# Patient Record
Sex: Male | Born: 1940 | Race: White | Hispanic: No | Marital: Married | Smoking: Current every day smoker
Health system: Southern US, Community
[De-identification: ages and names within clinical notes are randomized; demographics above are authoritative.]

## PROBLEM LIST (undated history)

## (undated) DIAGNOSIS — I10 Essential (primary) hypertension: Secondary | ICD-10-CM

## (undated) DIAGNOSIS — E7800 Pure hypercholesterolemia, unspecified: Secondary | ICD-10-CM

---

## 2004-08-21 ENCOUNTER — Ambulatory Visit (HOSPITAL_COMMUNITY): Admission: RE | Admit: 2004-08-21 | Discharge: 2004-08-21 | Payer: Self-pay | Admitting: Gastroenterology

## 2011-06-29 DIAGNOSIS — N529 Male erectile dysfunction, unspecified: Secondary | ICD-10-CM | POA: Insufficient documentation

## 2011-06-29 DIAGNOSIS — N3941 Urge incontinence: Secondary | ICD-10-CM | POA: Insufficient documentation

## 2014-12-06 ENCOUNTER — Other Ambulatory Visit: Payer: Self-pay | Admitting: Gastroenterology

## 2014-12-18 ENCOUNTER — Other Ambulatory Visit: Payer: Self-pay | Admitting: Gastroenterology

## 2014-12-18 DIAGNOSIS — K635 Polyp of colon: Secondary | ICD-10-CM

## 2015-01-07 ENCOUNTER — Other Ambulatory Visit: Payer: Self-pay

## 2015-01-08 ENCOUNTER — Ambulatory Visit
Admission: RE | Admit: 2015-01-08 | Discharge: 2015-01-08 | Disposition: A | Discharge status: Home / Self Care | Payer: PPO | Source: Ambulatory Visit | Attending: Gastroenterology | Admitting: Gastroenterology

## 2015-01-08 DIAGNOSIS — K635 Polyp of colon: Secondary | ICD-10-CM

## 2015-02-25 DIAGNOSIS — N401 Enlarged prostate with lower urinary tract symptoms: Secondary | ICD-10-CM

## 2015-02-25 DIAGNOSIS — N138 Other obstructive and reflux uropathy: Secondary | ICD-10-CM | POA: Insufficient documentation

## 2015-07-25 DIAGNOSIS — G4731 Primary central sleep apnea: Secondary | ICD-10-CM | POA: Diagnosis not present

## 2015-09-02 DIAGNOSIS — N401 Enlarged prostate with lower urinary tract symptoms: Secondary | ICD-10-CM | POA: Diagnosis not present

## 2015-09-02 DIAGNOSIS — N3941 Urge incontinence: Secondary | ICD-10-CM | POA: Diagnosis not present

## 2015-09-02 DIAGNOSIS — N528 Other male erectile dysfunction: Secondary | ICD-10-CM | POA: Diagnosis not present

## 2015-09-02 DIAGNOSIS — R972 Elevated prostate specific antigen [PSA]: Secondary | ICD-10-CM | POA: Diagnosis not present

## 2015-11-20 DIAGNOSIS — I10 Essential (primary) hypertension: Secondary | ICD-10-CM | POA: Diagnosis not present

## 2015-11-20 DIAGNOSIS — M109 Gout, unspecified: Secondary | ICD-10-CM | POA: Diagnosis not present

## 2015-11-20 DIAGNOSIS — M1990 Unspecified osteoarthritis, unspecified site: Secondary | ICD-10-CM | POA: Diagnosis not present

## 2015-11-20 DIAGNOSIS — G4733 Obstructive sleep apnea (adult) (pediatric): Secondary | ICD-10-CM | POA: Diagnosis not present

## 2015-11-20 DIAGNOSIS — E782 Mixed hyperlipidemia: Secondary | ICD-10-CM | POA: Diagnosis not present

## 2016-04-01 DIAGNOSIS — H524 Presbyopia: Secondary | ICD-10-CM | POA: Diagnosis not present

## 2016-04-01 DIAGNOSIS — Z0100 Encounter for examination of eyes and vision without abnormal findings: Secondary | ICD-10-CM | POA: Diagnosis not present

## 2016-04-01 DIAGNOSIS — Z961 Presence of intraocular lens: Secondary | ICD-10-CM | POA: Diagnosis not present

## 2016-04-10 DIAGNOSIS — R972 Elevated prostate specific antigen [PSA]: Secondary | ICD-10-CM | POA: Diagnosis not present

## 2016-04-10 DIAGNOSIS — N528 Other male erectile dysfunction: Secondary | ICD-10-CM | POA: Diagnosis not present

## 2016-04-10 DIAGNOSIS — N3941 Urge incontinence: Secondary | ICD-10-CM | POA: Diagnosis not present

## 2016-04-10 DIAGNOSIS — N401 Enlarged prostate with lower urinary tract symptoms: Secondary | ICD-10-CM | POA: Diagnosis not present

## 2016-08-03 DIAGNOSIS — Z0000 Encounter for general adult medical examination without abnormal findings: Secondary | ICD-10-CM | POA: Diagnosis not present

## 2016-08-03 DIAGNOSIS — I10 Essential (primary) hypertension: Secondary | ICD-10-CM | POA: Diagnosis not present

## 2016-08-03 DIAGNOSIS — G4733 Obstructive sleep apnea (adult) (pediatric): Secondary | ICD-10-CM | POA: Diagnosis not present

## 2016-08-03 DIAGNOSIS — E782 Mixed hyperlipidemia: Secondary | ICD-10-CM | POA: Diagnosis not present

## 2016-08-03 DIAGNOSIS — Z125 Encounter for screening for malignant neoplasm of prostate: Secondary | ICD-10-CM | POA: Diagnosis not present

## 2016-08-03 DIAGNOSIS — R972 Elevated prostate specific antigen [PSA]: Secondary | ICD-10-CM | POA: Diagnosis not present

## 2016-08-03 DIAGNOSIS — M1990 Unspecified osteoarthritis, unspecified site: Secondary | ICD-10-CM | POA: Diagnosis not present

## 2016-08-03 DIAGNOSIS — M109 Gout, unspecified: Secondary | ICD-10-CM | POA: Diagnosis not present

## 2016-08-03 DIAGNOSIS — N529 Male erectile dysfunction, unspecified: Secondary | ICD-10-CM | POA: Diagnosis not present

## 2016-08-25 ENCOUNTER — Encounter: Payer: Self-pay | Admitting: Emergency Medicine

## 2016-08-25 ENCOUNTER — Emergency Department
Admission: EM | Admit: 2016-08-25 | Discharge: 2016-08-26 | Disposition: A | Discharge status: Home / Self Care | Payer: PPO | Attending: Emergency Medicine | Admitting: Emergency Medicine

## 2016-08-25 DIAGNOSIS — I10 Essential (primary) hypertension: Secondary | ICD-10-CM

## 2016-08-25 DIAGNOSIS — F17290 Nicotine dependence, other tobacco product, uncomplicated: Secondary | ICD-10-CM | POA: Insufficient documentation

## 2016-08-25 DIAGNOSIS — R110 Nausea: Secondary | ICD-10-CM | POA: Diagnosis not present

## 2016-08-25 HISTORY — DX: Pure hypercholesterolemia, unspecified: E78.00

## 2016-08-25 HISTORY — DX: Essential (primary) hypertension: I10

## 2016-08-25 LAB — BASIC METABOLIC PANEL
ANION GAP: 7 (ref 5–15)
BUN: 21 mg/dL — ABNORMAL HIGH (ref 6–20)
CALCIUM: 8.8 mg/dL — AB (ref 8.9–10.3)
CHLORIDE: 103 mmol/L (ref 101–111)
CO2: 25 mmol/L (ref 22–32)
Creatinine, Ser: 1.24 mg/dL (ref 0.61–1.24)
GFR calc non Af Amer: 56 mL/min — ABNORMAL LOW (ref >60)
Glucose, Bld: 131 mg/dL — ABNORMAL HIGH (ref 65–99)
Potassium: 4.3 mmol/L (ref 3.5–5.1)
Sodium: 135 mmol/L (ref 135–145)

## 2016-08-25 LAB — CBC
HCT: 40.7 % (ref 40.0–52.0)
HEMOGLOBIN: 13.7 g/dL (ref 13.0–18.0)
MCH: 30.1 pg (ref 26.0–34.0)
MCHC: 33.6 g/dL (ref 32.0–36.0)
MCV: 89.5 fL (ref 80.0–100.0)
Platelets: 176 10*3/uL (ref 150–440)
RBC: 4.55 MIL/uL (ref 4.40–5.90)
RDW: 13.6 % (ref 11.5–14.5)
WBC: 9.5 10*3/uL (ref 3.8–10.6)

## 2016-08-25 LAB — TROPONIN I: Troponin I: <0.03 ng/mL (ref <0.03)

## 2016-08-25 NOTE — ED Triage Notes (Signed)
Pt ambulatory to triage with steady gait with c/o hypertension, nausea and headache starting this evening. Pt reports was working outside and began to have headache and feel nauseous. Pt states checked bp at home, 183/91. Pt denies chest pain, shortness of breath, or dizziness. Pt alert and oriented x 4, respirations even and unlabored.

## 2016-08-26 LAB — TROPONIN I

## 2016-08-26 NOTE — Discharge Instructions (Signed)
Please follow up with your primary care physician.

## 2016-08-26 NOTE — ED Notes (Signed)
Pt discharged to home.  Family member driving.  Discharge instructions reviewed.  Verbalized understanding.  No questions or concerns at this time.  Teach back verified.  Pt in NAD.  No items left in ED.   

## 2016-08-26 NOTE — ED Provider Notes (Signed)
[facility] Emergency Department Provider Note   ____________________________________________   First MD Initiated Contact with Patient 08/25/16 2312     (approximate)  I have reviewed the triage vital signs and the nursing notes.   HISTORY  Chief Complaint Hypertension    HPI Jon Fischer is a 76 y.o. male who comes into the hospital today with some high blood pressure and nausea. He reports that he was in his right around 5 PM and started feeling a little nauseous. He took the blood pressure and 164/101. The patient reports that he went into the house and tried to relax and sit but every time he took a blood pressure Going up. His highest blood pressure was 183/91. He reports his blood pressures typically in the 120s over 60s. The patient denies any chest pain and dizziness that he did have a slight headache. He denies shortness of breath as well. The patient took his blood pressure medicines this morning and didn't take anything else tonight. He reports that when he was in the ER he was picking up limbs and he had been walking his dog earlier. The patient states that he had worked up a sweat while he was in the yard. Currently though the patient reports that he is not sweating and his nausea is gone. He is here today for evaluation of these symptoms.   Past Medical History:  Diagnosis Date  . Hypercholesteremia   . Hypertension     There are no active problems to display for this patient.   History reviewed. No pertinent surgical history.  Prior to Admission medications   Not on File    Allergies Patient has no known allergies.  No family history on file.  Social History Social History  Substance Use Topics  . Smoking status: Current Every Day Smoker    Types: Cigars  . Smokeless tobacco: Never Used  . Alcohol use Yes     Comment: occ    Review of Systems Constitutional: No fever/chills Eyes: No visual changes. ENT: No sore  throat. Cardiovascular: Denies chest pain. Respiratory: Denies shortness of breath. Gastrointestinal: Nausea with No abdominal pain. no vomiting.  No diarrhea.  No constipation. Genitourinary: Negative for dysuria. Musculoskeletal: Negative for back pain. Skin: Negative for rash. Neurological: Negative for headaches, focal weakness or numbness.  10-point ROS otherwise negative.  ____________________________________________   PHYSICAL EXAM:  VITAL SIGNS: ED Triage Vitals  Enc Vitals Group     BP 08/25/16 1910 (!) 174/88     Pulse Rate 08/25/16 1910 83     Resp 08/25/16 1910 18     Temp 08/25/16 1910 98.1 F (36.7 C)     Temp Source 08/25/16 1910 Oral     SpO2 08/25/16 1910 98 %     Weight 08/25/16 1911 240 lb (108.9 kg)     Height 08/25/16 1911 5\' 10"  (1.778 m)     Head Circumference --      Peak Flow --      Pain Score --      Pain Loc --      Pain Edu? --      Excl. in [place]? --     Constitutional: Alert and oriented. Well appearing and in no acute distress. Eyes: Conjunctivae are normal. PERRL. EOMI. Head: Atraumatic. Nose: No congestion/rhinnorhea. Mouth/Throat: Mucous membranes are moist.  Oropharynx non-erythematous. Cardiovascular: Normal rate, regular rhythm. Grossly normal heart sounds.  Good peripheral circulation. Respiratory: Normal respiratory effort.  No retractions. Lungs  CTAB. Gastrointestinal: Soft and nontender. No distention. Positive bowel sounds Musculoskeletal: No lower extremity tenderness nor edema.   Neurologic:  Normal speech and language. Cranial nerves II through XII are grossly intact with no focal motor or neuro deficits Skin:  Skin is warm, dry and intact.  Psychiatric: Mood and affect are normal.   ____________________________________________   LABS (all labs ordered are listed, but only abnormal results are displayed)  Labs Reviewed  BASIC METABOLIC PANEL - Abnormal; Notable for the following:       Result Value   Glucose, Bld 131  (*)    BUN 21 (*)    Calcium 8.8 (*)    GFR calc non Af Amer 56 (*)    All other components within normal limits  CBC  TROPONIN I  TROPONIN I   ____________________________________________  EKG  ED ECG REPORT I, Loney Hering, the attending physician, personally viewed and interpreted this ECG.   Date: 08/25/2016  EKG Time: 1916  Rate: 83  Rhythm: normal sinus rhythm  Axis: normal  Intervals:none  ST&T Change: none  ____________________________________________  RADIOLOGY  none ____________________________________________   PROCEDURES  Procedure(s) performed: None  Procedures  Critical Care performed: No  ____________________________________________   INITIAL IMPRESSION / ASSESSMENT AND PLAN / ED COURSE  Pertinent labs & imaging results that were available during my care of the patient were reviewed by me and considered in my medical decision making (see chart for details).  This is a 76 year old male who comes into the hospital today was similar to blood pressure as well as low but nausea. The patient was concerned because blood pressure was going up. He did not have any shortness of breath headache or chest pain. The patient's initial blood work is unremarkable. During my evaluation I discussed the patient that sometimes when he continually checked her blood pressure will go up. Here in the emergency department though his blood pressures in the 140s over 90s. He has not received any medication. We'll repeat the patient's troponin and we will disposition the patient.     The patient's symptoms are resolved at this time and his blood pressure is improved. I informed the patient that he should follow-up with his doctor to determine if they wanted do anything else about the blood pressure but I did tell him that maybe he should decrease the heavy lifting up limbs in his yard at this time until he is evaluated by his doctor. The patient will be discharged home to  follow-up with his primary care physician. ____________________________________________   FINAL CLINICAL IMPRESSION(S) / ED DIAGNOSES  Final diagnoses:  Hypertension, unspecified type  Nausea      NEW MEDICATIONS STARTED DURING THIS VISIT:  There are no discharge medications for this patient.    Note:  This document was prepared using Dragon voice recognition software and may include unintentional dictation errors.    Loney Hering, MD 08/26/16 [phone]

## 2016-10-09 DIAGNOSIS — N528 Other male erectile dysfunction: Secondary | ICD-10-CM | POA: Diagnosis not present

## 2016-10-09 DIAGNOSIS — N138 Other obstructive and reflux uropathy: Secondary | ICD-10-CM | POA: Diagnosis not present

## 2016-10-09 DIAGNOSIS — N401 Enlarged prostate with lower urinary tract symptoms: Secondary | ICD-10-CM | POA: Diagnosis not present

## 2016-10-09 DIAGNOSIS — R972 Elevated prostate specific antigen [PSA]: Secondary | ICD-10-CM | POA: Diagnosis not present

## 2017-03-16 DIAGNOSIS — L821 Other seborrheic keratosis: Secondary | ICD-10-CM | POA: Diagnosis not present

## 2017-03-16 DIAGNOSIS — X32XXXA Exposure to sunlight, initial encounter: Secondary | ICD-10-CM | POA: Diagnosis not present

## 2017-03-16 DIAGNOSIS — L814 Other melanin hyperpigmentation: Secondary | ICD-10-CM | POA: Diagnosis not present

## 2017-04-06 DIAGNOSIS — G4731 Primary central sleep apnea: Secondary | ICD-10-CM | POA: Diagnosis not present

## 2017-04-14 DIAGNOSIS — H524 Presbyopia: Secondary | ICD-10-CM | POA: Diagnosis not present

## 2017-04-14 DIAGNOSIS — H52222 Regular astigmatism, left eye: Secondary | ICD-10-CM | POA: Diagnosis not present

## 2017-04-14 DIAGNOSIS — H5202 Hypermetropia, left eye: Secondary | ICD-10-CM | POA: Diagnosis not present

## 2017-04-14 DIAGNOSIS — Z961 Presence of intraocular lens: Secondary | ICD-10-CM | POA: Diagnosis not present

## 2017-04-16 DIAGNOSIS — N401 Enlarged prostate with lower urinary tract symptoms: Secondary | ICD-10-CM | POA: Diagnosis not present

## 2017-04-16 DIAGNOSIS — N529 Male erectile dysfunction, unspecified: Secondary | ICD-10-CM | POA: Diagnosis not present

## 2017-04-16 DIAGNOSIS — N3941 Urge incontinence: Secondary | ICD-10-CM | POA: Diagnosis not present

## 2017-04-16 DIAGNOSIS — R972 Elevated prostate specific antigen [PSA]: Secondary | ICD-10-CM | POA: Diagnosis not present

## 2017-04-20 ENCOUNTER — Other Ambulatory Visit: Payer: Self-pay

## 2017-04-21 ENCOUNTER — Ambulatory Visit (INDEPENDENT_AMBULATORY_CARE_PROVIDER_SITE_OTHER): Payer: PPO | Admitting: Podiatry

## 2017-04-21 ENCOUNTER — Encounter: Payer: Self-pay | Admitting: Podiatry

## 2017-04-21 VITALS — BP 138/73 | HR 70 | Resp 16

## 2017-04-21 DIAGNOSIS — M79676 Pain in unspecified toe(s): Secondary | ICD-10-CM

## 2017-04-21 DIAGNOSIS — L601 Onycholysis: Secondary | ICD-10-CM | POA: Diagnosis not present

## 2017-04-21 DIAGNOSIS — B351 Tinea unguium: Secondary | ICD-10-CM | POA: Diagnosis not present

## 2017-04-21 DIAGNOSIS — L603 Nail dystrophy: Secondary | ICD-10-CM | POA: Diagnosis not present

## 2017-04-21 NOTE — Progress Notes (Signed)
  Subjective:  Patient ID: Jon Fischer, male    DOB: 1940/08/27,  MRN: [id number] HPI Chief Complaint  Patient presents with  . Nail Problem    Patient presents for nail care-thick, discolored nails for years, hard to cut himself, callused areas around nails    76 y.o. male presents with the above complaint.     Past Medical History:  Diagnosis Date  . Hypercholesteremia   . Hypertension    No past surgical history on file.  Current Outpatient Prescriptions:  .  allopurinol (ZYLOPRIM) 100 MG tablet, Take by mouth., Disp: , Rfl:  .  Ascorbic Acid (VITAMIN C) 1000 MG tablet, Take 1,000 mg by mouth., Disp: , Rfl:  .  aspirin EC 81 MG tablet, Take by mouth., Disp: , Rfl:  .  DOCOSAHEXAENOIC ACID PO, Take 2 g by mouth., Disp: , Rfl:  .  docusate sodium (STOOL SOFTENER) 100 MG capsule, Take 100 mg by mouth., Disp: , Rfl:  .  lisinopril (PRINIVIL,ZESTRIL) 10 MG tablet, Take 20 mg by mouth., Disp: , Rfl:  .  Menthol, Topical Analgesic, (MINERAL ICE) 2 % GEL, , Disp: , Rfl:  .  sildenafil (VIAGRA) 100 MG tablet, Take 100 mg by mouth., Disp: , Rfl:  .  simvastatin (ZOCOR) 10 MG tablet, Take 10 mg by mouth., Disp: , Rfl:  .  verapamil (VERELAN PM) 120 MG 24 hr capsule, Take 120 mg by mouth., Disp: , Rfl:  .  vitamin E 400 UNIT capsule, Take by mouth., Disp: , Rfl:   No Known Allergies Review of Systems  Musculoskeletal: Positive for arthralgias.  All other systems reviewed and are negative.  Objective:   Vitals:   04/21/17 1005  BP: 138/73  Pulse: 70  Resp: 16    General: Well developed, nourished, in no acute distress, alert and oriented x3   Dermatological: Skin is warm, dry and supple bilateral. Nails x 10 are well maintained; remaining integument appears unremarkable at this time. There are no open sores, no preulcerative lesions, no rash or signs of infection present.His toenails are long thick yellow dystrophic clinically mycotic severely thickened and incurvated. He  also has thick skin surrounding the nails and some peeling indicative of tinea pedis.  Vascular: Dorsalis Pedis artery and Posterior Tibial artery pedal pulses are 2/4 bilateral with immedate capillary fill time. Pedal hair growth present. No varicosities and no lower extremity edema present bilateral.   Neruologic: Grossly intact via light touch bilateral. Vibratory intact via tuning fork bilateral. Protective threshold with Semmes Wienstein monofilament intact to all pedal sites bilateral. Patellar and Achilles deep tendon reflexes 2+ bilateral. No Babinski or clonus noted bilateral.   Musculoskeletal: No gross boney pedal deformities bilateral. No pain, crepitus, or limitation noted with foot and ankle range of motion bilateral. Muscular strength 5/5 in all groups tested bilateral.  Gait: Unassisted, Nonantalgic.    Radiographs:  None taken  Assessment & Plan:   Assessment: Pain in limb secondary to onychomycosis and tinea pedis.  Plan: To samples of the nails and skin today to be sent for pathologic evaluation one notified him as to those results.     Rigo Letts T. [place], [place]

## 2017-04-22 DIAGNOSIS — E782 Mixed hyperlipidemia: Secondary | ICD-10-CM | POA: Diagnosis not present

## 2017-04-22 DIAGNOSIS — M109 Gout, unspecified: Secondary | ICD-10-CM | POA: Diagnosis not present

## 2017-04-22 DIAGNOSIS — I10 Essential (primary) hypertension: Secondary | ICD-10-CM | POA: Diagnosis not present

## 2017-05-19 ENCOUNTER — Ambulatory Visit (INDEPENDENT_AMBULATORY_CARE_PROVIDER_SITE_OTHER): Payer: PPO | Admitting: Podiatry

## 2017-05-19 ENCOUNTER — Encounter: Payer: Self-pay | Admitting: Podiatry

## 2017-05-19 DIAGNOSIS — Z79899 Other long term (current) drug therapy: Secondary | ICD-10-CM | POA: Diagnosis not present

## 2017-05-19 DIAGNOSIS — L603 Nail dystrophy: Secondary | ICD-10-CM | POA: Diagnosis not present

## 2017-05-19 MED ORDER — TERBINAFINE HCL 250 MG PO TABS: 250.0000 mg | ORAL_TABLET | Freq: Every day | ORAL | 0 refills | Status: DC

## 2017-05-19 NOTE — Patient Instructions (Signed)

## 2017-05-19 NOTE — Progress Notes (Signed)
He presents today for follow-up of his pathology report for his toenails. States that he is doing same.  Objective: Pathology report demonstrates dermatophytic infection.  Assessment: Onychomycosis.  Plan: Discussed etiology pathology conservative versus surgical therapies with him. Discussed the use of laser therapy topical therapy as well as oral therapy. He understands this and is amenable to it we started him on Lamisil 250 mg tablets 1 by mouth daily 30 days. He is just had a liver profile performed recently so we will not have to ask for another one at this point. I will ask for 1:30 days from now.

## 2017-06-16 ENCOUNTER — Ambulatory Visit: Payer: PPO | Admitting: Podiatry

## 2017-06-16 ENCOUNTER — Encounter: Payer: Self-pay | Admitting: Podiatry

## 2017-06-16 DIAGNOSIS — Z79899 Other long term (current) drug therapy: Secondary | ICD-10-CM

## 2017-06-16 DIAGNOSIS — M79676 Pain in unspecified toe(s): Secondary | ICD-10-CM | POA: Diagnosis not present

## 2017-06-16 DIAGNOSIS — B351 Tinea unguium: Secondary | ICD-10-CM

## 2017-06-16 DIAGNOSIS — L603 Nail dystrophy: Secondary | ICD-10-CM | POA: Diagnosis not present

## 2017-06-16 MED ORDER — TERBINAFINE HCL 250 MG PO TABS: 250.0000 mg | ORAL_TABLET | Freq: Every day | ORAL | 0 refills | Status: DC

## 2017-06-16 NOTE — Progress Notes (Signed)
He presents today after taking his first 30 days of Lamisil. He states that he was doing very good. No problems taking the medication and rash and fever chills itching. He is also requesting that we trim his toenails for him today.  Objective: Vital signs are stable he is alert and oriented 3. Total toenails involved are 1 through 5 bilateral which do not demonstrate any changes as of yet. His toenails are thick yellow dystrophic with mycotic and painful.  Assessment: Pain in limb secondary to onychomycosis.  Plan: Debridement of toenails 1 through 5 bilateral. I also wrote him another prescription for Lamisil 250 mg tablets #90 and requested another liver profile. Should this blood work back abnormal we will notify me immediately. He will notify us with questions or concerns regarding medication.

## 2017-06-17 ENCOUNTER — Telehealth: Payer: Self-pay | Admitting: Podiatry

## 2017-06-17 LAB — HEPATIC FUNCTION PANEL
ALBUMIN: 4.4 g/dL (ref 3.5–4.8)
ALT: 11 IU/L (ref 0–44)
AST: 15 IU/L (ref 0–40)
Alkaline Phosphatase: 75 IU/L (ref 39–117)
BILIRUBIN, DIRECT: 0.13 mg/dL (ref 0.00–0.40)
Bilirubin Total: 0.4 mg/dL (ref 0.0–1.2)
Total Protein: 6.5 g/dL (ref 6.0–8.5)

## 2017-08-25 DIAGNOSIS — E782 Mixed hyperlipidemia: Secondary | ICD-10-CM | POA: Diagnosis not present

## 2017-08-25 DIAGNOSIS — M109 Gout, unspecified: Secondary | ICD-10-CM | POA: Diagnosis not present

## 2017-08-25 DIAGNOSIS — Z0000 Encounter for general adult medical examination without abnormal findings: Secondary | ICD-10-CM | POA: Diagnosis not present

## 2017-08-25 DIAGNOSIS — R972 Elevated prostate specific antigen [PSA]: Secondary | ICD-10-CM | POA: Diagnosis not present

## 2017-08-25 DIAGNOSIS — Z6836 Body mass index (BMI) 36.0-36.9, adult: Secondary | ICD-10-CM | POA: Diagnosis not present

## 2017-08-25 DIAGNOSIS — M1990 Unspecified osteoarthritis, unspecified site: Secondary | ICD-10-CM | POA: Diagnosis not present

## 2017-08-25 DIAGNOSIS — I10 Essential (primary) hypertension: Secondary | ICD-10-CM | POA: Diagnosis not present

## 2017-08-25 DIAGNOSIS — G4733 Obstructive sleep apnea (adult) (pediatric): Secondary | ICD-10-CM | POA: Diagnosis not present

## 2017-10-04 ENCOUNTER — Encounter: Payer: Self-pay | Admitting: Podiatry

## 2017-10-04 ENCOUNTER — Ambulatory Visit: Payer: PPO | Admitting: Podiatry

## 2017-10-04 DIAGNOSIS — Z79899 Other long term (current) drug therapy: Secondary | ICD-10-CM | POA: Diagnosis not present

## 2017-10-04 DIAGNOSIS — L603 Nail dystrophy: Secondary | ICD-10-CM

## 2017-10-04 MED ORDER — TERBINAFINE HCL 250 MG PO TABS: 250.0000 mg | ORAL_TABLET | Freq: Every day | ORAL | 0 refills | Status: DC

## 2017-10-04 NOTE — Progress Notes (Signed)
He presents today for follow-up of his nail fungus.  He states that he is completed his 120 days of Lamisil therapy had no problems with the medication at all.  He states that he had a slight improvement.  He states that his nails are just growing more slowly.  Objective: Vital signs are stable alert and oriented x3.  Pulses are palpable.  Neurologic sensorium is intact.  His toenails appear to be growing out and they have grown out approximately 25%.  The remainder of the nail still appears to be thick yellow dystrophic and mycotic.  Assessment: Slowly resolving onychomycosis secondary to Lamisil therapy.  Plan: Debridement of toenails today 1 through 5 bilateral cover service secondary to pain.  I also requested that he start Lamisil 250 mg tablets 1 tablet every other day by mouth I will follow-up with him in 3 months.  Should he have questions or concerns regarding medications or therapy protocol he will notify us immediately.

## 2017-10-04 NOTE — Patient Instructions (Signed)
Dr. [name] has sent over a refill for Lamisil to your pharmacy today. The instructions on your bottle will say "take 1 tablet daily", however, he would like for you to take one pill every other day. He will follow up with you in 3 months to re-evaluate your toenails. 

## 2017-10-18 ENCOUNTER — Ambulatory Visit: Payer: PPO | Admitting: Podiatry

## 2017-10-25 DIAGNOSIS — N3941 Urge incontinence: Secondary | ICD-10-CM | POA: Diagnosis not present

## 2017-10-25 DIAGNOSIS — R972 Elevated prostate specific antigen [PSA]: Secondary | ICD-10-CM | POA: Diagnosis not present

## 2017-10-25 DIAGNOSIS — N401 Enlarged prostate with lower urinary tract symptoms: Secondary | ICD-10-CM | POA: Diagnosis not present

## 2017-10-25 DIAGNOSIS — N529 Male erectile dysfunction, unspecified: Secondary | ICD-10-CM | POA: Diagnosis not present

## 2018-01-10 ENCOUNTER — Ambulatory Visit: Payer: PPO | Admitting: Podiatry

## 2018-01-10 ENCOUNTER — Encounter: Payer: Self-pay | Admitting: Podiatry

## 2018-01-10 DIAGNOSIS — Z79899 Other long term (current) drug therapy: Secondary | ICD-10-CM | POA: Diagnosis not present

## 2018-01-10 DIAGNOSIS — L603 Nail dystrophy: Secondary | ICD-10-CM

## 2018-01-10 MED ORDER — TERBINAFINE HCL 250 MG PO TABS: 250.0000 mg | ORAL_TABLET | Freq: Every day | ORAL | 0 refills | Status: DC

## 2018-01-10 NOTE — Progress Notes (Signed)
He presents today for follow-up of his onychomycosis states that is almost grown out.  Objective: Vital signs are stable he is alert and oriented x3.  He had no problems taking his medication whatsoever.  Pulses are palpable toenails appear to be 60 to 80% grown out.  Assessment: Well-healing onychomycosis long-term therapy with Lamisil.  Plan: Number to continue with Lamisil every other day for the next 60 days follow-up with him in 3 months I expect this to be completely resolved by that time.

## 2018-01-18 DIAGNOSIS — R972 Elevated prostate specific antigen [PSA]: Secondary | ICD-10-CM | POA: Diagnosis not present

## 2018-02-24 DIAGNOSIS — Z6836 Body mass index (BMI) 36.0-36.9, adult: Secondary | ICD-10-CM | POA: Diagnosis not present

## 2018-02-24 DIAGNOSIS — E782 Mixed hyperlipidemia: Secondary | ICD-10-CM | POA: Diagnosis not present

## 2018-02-24 DIAGNOSIS — R202 Paresthesia of skin: Secondary | ICD-10-CM | POA: Diagnosis not present

## 2018-02-24 DIAGNOSIS — I10 Essential (primary) hypertension: Secondary | ICD-10-CM | POA: Diagnosis not present

## 2018-04-13 ENCOUNTER — Ambulatory Visit: Payer: PPO | Admitting: Podiatry

## 2018-04-25 DIAGNOSIS — N3941 Urge incontinence: Secondary | ICD-10-CM | POA: Diagnosis not present

## 2018-04-25 DIAGNOSIS — N528 Other male erectile dysfunction: Secondary | ICD-10-CM | POA: Diagnosis not present

## 2018-04-25 DIAGNOSIS — N401 Enlarged prostate with lower urinary tract symptoms: Secondary | ICD-10-CM | POA: Diagnosis not present

## 2018-04-25 DIAGNOSIS — R972 Elevated prostate specific antigen [PSA]: Secondary | ICD-10-CM | POA: Diagnosis not present

## 2018-07-07 DIAGNOSIS — R311 Benign essential microscopic hematuria: Secondary | ICD-10-CM | POA: Diagnosis not present

## 2018-07-07 DIAGNOSIS — R8289 Other abnormal findings on cytological and histological examination of urine: Secondary | ICD-10-CM | POA: Diagnosis not present

## 2018-07-09 DIAGNOSIS — G4731 Primary central sleep apnea: Secondary | ICD-10-CM | POA: Diagnosis not present

## 2018-07-22 DIAGNOSIS — R972 Elevated prostate specific antigen [PSA]: Secondary | ICD-10-CM | POA: Diagnosis not present

## 2018-08-05 DIAGNOSIS — G4733 Obstructive sleep apnea (adult) (pediatric): Secondary | ICD-10-CM | POA: Diagnosis not present

## 2018-08-09 DIAGNOSIS — G4731 Primary central sleep apnea: Secondary | ICD-10-CM | POA: Diagnosis not present

## 2018-09-02 DIAGNOSIS — G4731 Primary central sleep apnea: Secondary | ICD-10-CM | POA: Diagnosis not present

## 2018-09-09 DIAGNOSIS — G4731 Primary central sleep apnea: Secondary | ICD-10-CM | POA: Diagnosis not present

## 2018-09-12 DIAGNOSIS — I10 Essential (primary) hypertension: Secondary | ICD-10-CM | POA: Diagnosis not present

## 2018-09-12 DIAGNOSIS — R972 Elevated prostate specific antigen [PSA]: Secondary | ICD-10-CM | POA: Diagnosis not present

## 2018-09-12 DIAGNOSIS — Z6837 Body mass index (BMI) 37.0-37.9, adult: Secondary | ICD-10-CM | POA: Diagnosis not present

## 2018-09-12 DIAGNOSIS — E782 Mixed hyperlipidemia: Secondary | ICD-10-CM | POA: Diagnosis not present

## 2018-09-12 DIAGNOSIS — G4733 Obstructive sleep apnea (adult) (pediatric): Secondary | ICD-10-CM | POA: Diagnosis not present

## 2018-09-12 DIAGNOSIS — M109 Gout, unspecified: Secondary | ICD-10-CM | POA: Diagnosis not present

## 2018-09-12 DIAGNOSIS — M1990 Unspecified osteoarthritis, unspecified site: Secondary | ICD-10-CM | POA: Diagnosis not present

## 2018-09-12 DIAGNOSIS — Z0000 Encounter for general adult medical examination without abnormal findings: Secondary | ICD-10-CM | POA: Diagnosis not present

## 2018-10-01 DIAGNOSIS — G4731 Primary central sleep apnea: Secondary | ICD-10-CM | POA: Diagnosis not present

## 2018-10-08 DIAGNOSIS — G4731 Primary central sleep apnea: Secondary | ICD-10-CM | POA: Diagnosis not present

## 2018-11-01 DIAGNOSIS — G4731 Primary central sleep apnea: Secondary | ICD-10-CM | POA: Diagnosis not present

## 2018-11-07 DIAGNOSIS — G4733 Obstructive sleep apnea (adult) (pediatric): Secondary | ICD-10-CM | POA: Diagnosis not present

## 2018-11-08 DIAGNOSIS — G4731 Primary central sleep apnea: Secondary | ICD-10-CM | POA: Diagnosis not present

## 2018-12-01 DIAGNOSIS — G4731 Primary central sleep apnea: Secondary | ICD-10-CM | POA: Diagnosis not present

## 2018-12-08 DIAGNOSIS — G4731 Primary central sleep apnea: Secondary | ICD-10-CM | POA: Diagnosis not present

## 2019-01-01 DIAGNOSIS — G4731 Primary central sleep apnea: Secondary | ICD-10-CM | POA: Diagnosis not present

## 2019-01-08 DIAGNOSIS — G4731 Primary central sleep apnea: Secondary | ICD-10-CM | POA: Diagnosis not present

## 2019-01-31 DIAGNOSIS — G4731 Primary central sleep apnea: Secondary | ICD-10-CM | POA: Diagnosis not present

## 2019-02-07 DIAGNOSIS — G4731 Primary central sleep apnea: Secondary | ICD-10-CM | POA: Diagnosis not present

## 2019-02-10 DIAGNOSIS — G4731 Primary central sleep apnea: Secondary | ICD-10-CM | POA: Diagnosis not present

## 2019-03-03 DIAGNOSIS — G4731 Primary central sleep apnea: Secondary | ICD-10-CM | POA: Diagnosis not present

## 2019-03-10 DIAGNOSIS — G4731 Primary central sleep apnea: Secondary | ICD-10-CM | POA: Diagnosis not present

## 2019-03-29 DIAGNOSIS — E782 Mixed hyperlipidemia: Secondary | ICD-10-CM | POA: Diagnosis not present

## 2019-03-29 DIAGNOSIS — I10 Essential (primary) hypertension: Secondary | ICD-10-CM | POA: Diagnosis not present

## 2019-03-31 DIAGNOSIS — Z7189 Other specified counseling: Secondary | ICD-10-CM | POA: Diagnosis not present

## 2019-03-31 DIAGNOSIS — Z6836 Body mass index (BMI) 36.0-36.9, adult: Secondary | ICD-10-CM | POA: Diagnosis not present

## 2019-03-31 DIAGNOSIS — I10 Essential (primary) hypertension: Secondary | ICD-10-CM | POA: Diagnosis not present

## 2019-03-31 DIAGNOSIS — E782 Mixed hyperlipidemia: Secondary | ICD-10-CM | POA: Diagnosis not present

## 2019-03-31 DIAGNOSIS — M109 Gout, unspecified: Secondary | ICD-10-CM | POA: Diagnosis not present

## 2019-03-31 DIAGNOSIS — L309 Dermatitis, unspecified: Secondary | ICD-10-CM | POA: Diagnosis not present

## 2019-04-10 DIAGNOSIS — R972 Elevated prostate specific antigen [PSA]: Secondary | ICD-10-CM | POA: Diagnosis not present

## 2019-04-30 DIAGNOSIS — R3915 Urgency of urination: Secondary | ICD-10-CM | POA: Diagnosis not present

## 2019-04-30 DIAGNOSIS — R339 Retention of urine, unspecified: Secondary | ICD-10-CM | POA: Diagnosis not present

## 2019-05-04 DIAGNOSIS — R339 Retention of urine, unspecified: Secondary | ICD-10-CM | POA: Diagnosis not present

## 2019-05-04 DIAGNOSIS — N3941 Urge incontinence: Secondary | ICD-10-CM | POA: Diagnosis not present

## 2019-05-04 DIAGNOSIS — R972 Elevated prostate specific antigen [PSA]: Secondary | ICD-10-CM | POA: Diagnosis not present

## 2019-05-04 DIAGNOSIS — N401 Enlarged prostate with lower urinary tract symptoms: Secondary | ICD-10-CM | POA: Diagnosis not present

## 2019-05-04 DIAGNOSIS — N529 Male erectile dysfunction, unspecified: Secondary | ICD-10-CM | POA: Diagnosis not present

## 2019-05-08 DIAGNOSIS — N401 Enlarged prostate with lower urinary tract symptoms: Secondary | ICD-10-CM | POA: Diagnosis not present

## 2019-05-08 DIAGNOSIS — N3941 Urge incontinence: Secondary | ICD-10-CM | POA: Diagnosis not present

## 2019-05-08 DIAGNOSIS — R972 Elevated prostate specific antigen [PSA]: Secondary | ICD-10-CM | POA: Diagnosis not present

## 2019-05-08 DIAGNOSIS — R338 Other retention of urine: Secondary | ICD-10-CM | POA: Diagnosis not present

## 2019-05-09 DIAGNOSIS — R339 Retention of urine, unspecified: Secondary | ICD-10-CM | POA: Diagnosis not present

## 2019-05-09 DIAGNOSIS — Z466 Encounter for fitting and adjustment of urinary device: Secondary | ICD-10-CM | POA: Diagnosis not present

## 2019-05-22 DIAGNOSIS — R972 Elevated prostate specific antigen [PSA]: Secondary | ICD-10-CM | POA: Diagnosis not present

## 2019-05-22 DIAGNOSIS — R339 Retention of urine, unspecified: Secondary | ICD-10-CM | POA: Diagnosis not present

## 2019-05-22 DIAGNOSIS — N401 Enlarged prostate with lower urinary tract symptoms: Secondary | ICD-10-CM | POA: Diagnosis not present

## 2019-05-22 DIAGNOSIS — N529 Male erectile dysfunction, unspecified: Secondary | ICD-10-CM | POA: Diagnosis not present

## 2019-05-22 DIAGNOSIS — N451 Epididymitis: Secondary | ICD-10-CM | POA: Diagnosis not present

## 2019-05-22 DIAGNOSIS — N3941 Urge incontinence: Secondary | ICD-10-CM | POA: Diagnosis not present

## 2019-05-23 ENCOUNTER — Emergency Department
Admission: EM | Admit: 2019-05-23 | Discharge: 2019-05-24 | Disposition: A | Discharge status: Home / Self Care | Payer: PPO | Attending: Emergency Medicine | Admitting: Emergency Medicine

## 2019-05-23 ENCOUNTER — Other Ambulatory Visit: Payer: Self-pay

## 2019-05-23 DIAGNOSIS — I10 Essential (primary) hypertension: Secondary | ICD-10-CM | POA: Diagnosis not present

## 2019-05-23 DIAGNOSIS — N401 Enlarged prostate with lower urinary tract symptoms: Secondary | ICD-10-CM | POA: Insufficient documentation

## 2019-05-23 DIAGNOSIS — R319 Hematuria, unspecified: Secondary | ICD-10-CM | POA: Insufficient documentation

## 2019-05-23 DIAGNOSIS — F17290 Nicotine dependence, other tobacco product, uncomplicated: Secondary | ICD-10-CM | POA: Insufficient documentation

## 2019-05-23 DIAGNOSIS — Z7982 Long term (current) use of aspirin: Secondary | ICD-10-CM | POA: Diagnosis not present

## 2019-05-23 DIAGNOSIS — Z79899 Other long term (current) drug therapy: Secondary | ICD-10-CM | POA: Diagnosis not present

## 2019-05-23 DIAGNOSIS — T8383XA Hemorrhage of genitourinary prosthetic devices, implants and grafts, initial encounter: Secondary | ICD-10-CM | POA: Diagnosis not present

## 2019-05-23 LAB — BASIC METABOLIC PANEL
Anion gap: 13 (ref 5–15)
BUN: 19 mg/dL (ref 8–23)
CO2: 24 mmol/L (ref 22–32)
Calcium: 9.4 mg/dL (ref 8.9–10.3)
Chloride: 102 mmol/L (ref 98–111)
Creatinine, Ser: 1.21 mg/dL (ref 0.61–1.24)
GFR calc Af Amer: >60 mL/min (ref >60)
GFR calc non Af Amer: 57 mL/min — ABNORMAL LOW (ref >60)
Glucose, Bld: 114 mg/dL — ABNORMAL HIGH (ref 70–99)
Potassium: 4.1 mmol/L (ref 3.5–5.1)
Sodium: 139 mmol/L (ref 135–145)

## 2019-05-23 LAB — CBC
HCT: 40.3 % (ref 39.0–52.0)
Hemoglobin: 13.4 g/dL (ref 13.0–17.0)
MCH: 30 pg (ref 26.0–34.0)
MCHC: 33.3 g/dL (ref 30.0–36.0)
MCV: 90.4 fL (ref 80.0–100.0)
Platelets: 262 10*3/uL (ref 150–400)
RBC: 4.46 MIL/uL (ref 4.22–5.81)
RDW: 12.4 % (ref 11.5–15.5)
WBC: 13.3 10*3/uL — ABNORMAL HIGH (ref 4.0–10.5)
nRBC: 0 % (ref 0.0–0.2)

## 2019-05-23 MED ORDER — LIDOCAINE HCL URETHRAL/MUCOSAL 2 % EX GEL
1.0000 "application " | Freq: Once | CUTANEOUS | Status: AC
  Administered 2019-05-24: 1 via URETHRAL
  Filled 2019-05-23: qty 10

## 2019-05-23 NOTE — ED Provider Notes (Signed)
[facility] Emergency Department Provider Note  ____________________________________________  Time seen: Approximately 11:57 PM  I have reviewed the triage vital signs and the nursing notes.   HISTORY  Chief Complaint Hematuria   HPI Jon Fischer is a 78 y.o. 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  Patient seen his urologist yesterday and diagnosed with epididymitis.  He was started on Cipro.  He was told to self cath every evening to help clear the infection.  This evening was the first time the patient attempted to self catheterization.  He denies encountering any difficulties during the process however immediately after started having hematuria.  Has passed several large clots.  He denies abdominal pain, dysuria, fever, flank pain, chills, nausea or vomiting.  Patient is not on blood thinners but does take aspirin.   Patient called his urologist recommended coming to the emergency room for Foley catheter placement to allow for healing of possible traumatic urethral injury.  Past Medical History:  Diagnosis Date  . Hypercholesteremia   . Hypertension     Patient Active Problem List   Diagnosis Date Noted  . Benign prostatic hyperplasia with lower urinary tract symptoms 09/02/2015  . Benign prostatic hyperplasia with urinary obstruction 02/25/2015    History reviewed. No pertinent surgical history.  Prior to Admission medications   Medication Sig Start Date End Date Taking? Authorizing Provider  allopurinol (ZYLOPRIM) 100 MG tablet Take by mouth.    [provider]  Ascorbic Acid (VITAMIN C) 1000 MG tablet Take 1,000 mg by mouth.    [provider]  aspirin EC 81 MG tablet Take by mouth.    [provider]  DOCOSAHEXAENOIC ACID PO Take 2 g by mouth.    [provider]  docusate sodium (STOOL SOFTENER) 100 MG  capsule Take 100 mg by mouth.    [provider]  lisinopril (PRINIVIL,ZESTRIL) 20 MG tablet  08/09/17   [provider]  Menthol, Topical Analgesic, (MINERAL ICE) 2 % GEL  07/09/14   [provider]  sildenafil (VIAGRA) 100 MG tablet Take by mouth. 02/05/17   [provider]  simvastatin (ZOCOR) 10 MG tablet Take by mouth. 06/19/14   [provider]  terbinafine (LAMISIL) 250 MG tablet Take 1 tablet (250 mg total) by mouth daily. 10/04/17   [name], [name], DPM  terbinafine (LAMISIL) 250 MG tablet Take 1 tablet (250 mg total) by mouth daily. 01/10/18   [name], [name], DPM  verapamil (VERELAN PM) 120 MG 24 hr capsule Take by mouth. 06/20/14   [provider]  vitamin E 400 UNIT capsule Take by mouth.    [provider]    Allergies Patient has no known allergies.  No family history on file.  Social History Social History   Tobacco Use  . Smoking status: Current Every Day Smoker    Types: Cigars  . Smokeless tobacco: Never Used  Substance Use Topics  . Alcohol use: Yes    Comment: occ  . Drug use: Not on file    Review of Systems  Constitutional: Negative for fever. Eyes: Negative for visual changes. ENT: Negative for sore throat. Neck: No neck pain  Cardiovascular: Negative for chest pain. Respiratory: Negative for shortness of breath. Gastrointestinal: Negative for abdominal pain, vomiting or diarrhea. Genitourinary: Negative for dysuria. + hematuria Musculoskeletal: Negative for back pain. Skin: Negative for rash. Neurological: Negative  for headaches, weakness or numbness. Psych: No SI or HI  ____________________________________________   PHYSICAL EXAM:  VITAL SIGNS: ED Triage Vitals  Enc Vitals Group     BP 05/23/19 1926 (!) 160/82     Pulse Rate 05/23/19 1926 93     Resp 05/23/19 1926 15     Temp 05/23/19 1926 98.3 F (36.8 C)     Temp src --      SpO2 05/23/19 1926 98 %     Weight 05/23/19 1927 240 lb  (108.9 kg)     Height 05/23/19 1927 5\' 10"  (1.778 m)     Head Circumference --      Peak Flow --      Pain Score 05/23/19 1927 0     Pain Loc --      Pain Edu? --      Excl. in [place]? --     Constitutional: Alert and oriented. Well appearing and in no apparent distress. HEENT:      Head: Normocephalic and atraumatic.         Eyes: Conjunctivae are normal. Sclera is non-icteric.       Mouth/Throat: Mucous membranes are moist.       Neck: Supple with no signs of meningismus. Cardiovascular: Regular rate and rhythm. No murmurs, gallops, or rubs. 2+ symmetrical distal pulses are present in all extremities. No JVD. Respiratory: Normal respiratory effort. Lungs are clear to auscultation bilaterally. No wheezes, crackles, or rhonchi.  Gastrointestinal: Soft, non tender, and non distended with positive bowel sounds. No rebound or guarding. Genitourinary: No CVA tenderness. Musculoskeletal: Nontender with normal range of motion in all extremities. No edema, cyanosis, or erythema of extremities. Neurologic: Normal speech and language. Face is symmetric. Moving all extremities. No gross focal neurologic deficits are appreciated. Skin: Skin is warm, dry and intact. No rash noted. Psychiatric: Mood and affect are normal. Speech and behavior are normal.  ____________________________________________   LABS (all labs ordered are listed, but only abnormal results are displayed)  Labs Reviewed  URINALYSIS, COMPLETE (UACMP) WITH MICROSCOPIC - Abnormal; Notable for the following components:      Result Value   Color, Urine BROWN (*)    APPearance TURBID (*)    Hgb urine dipstick MODERATE (*)    Ketones, ur 5 (*)    Protein, ur 100 (*)    Leukocytes,Ua MODERATE (*)    RBC / HPF >50 (*)    WBC, UA >50 (*)    All other components within normal limits  BASIC METABOLIC PANEL - Abnormal; Notable for the following components:   Glucose, Bld 114 (*)    GFR calc non Af Amer 57 (*)    All other  components within normal limits  CBC - Abnormal; Notable for the following components:   WBC 13.3 (*)    All other components within normal limits  URINE CULTURE   ____________________________________________  EKG  none  ____________________________________________  RADIOLOGY  none  ____________________________________________   PROCEDURES  Procedure(s) performed: None Procedures Critical Care performed:  None ____________________________________________   INITIAL IMPRESSION / ASSESSMENT AND PLAN / ED COURSE  78 y.o. 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 Stable PVR of 180cc. Patient with gross hematuria here, most likely traumatic from self cath. Not on blood thinners (ASA only). HD stable with stable hgb. Will  place a foley catheter to allow for healing (avoidind self cath) and urinary retention due to clotting.     _________________________ 1:05 AM on 05/24/2019 -----------------------------------------  16 French coud Foley catheter was placed.  No evidence of retention due to blood clots.  Recommended continuing Cipro and close follow-up with his urologist.  Discussed Foley care and my standard return precautions.    As part of my medical decision making, I reviewed the following data within the [place] notes reviewed and incorporated, Labs reviewed , Old chart reviewed, Notes from prior ED visits and [place] Controlled Substance Database   Patient was evaluated in Emergency Department today for the symptoms described in the history of present illness. Patient was evaluated in the context of the global COVID-19 pandemic, which necessitated consideration that the patient might be at risk for infection with the SARS-CoV-2 virus that causes COVID-19. Institutional protocols and  algorithms that pertain to the evaluation of patients at risk for COVID-19 are in a state of rapid change based on information released by regulatory bodies including the CDC and federal and state organizations. These policies and algorithms were followed during the patient's care in the ED.   ____________________________________________   FINAL CLINICAL IMPRESSION(S) / ED DIAGNOSES   Final diagnoses:  Traumatic hematuria      NEW MEDICATIONS STARTED DURING THIS VISIT:  ED Discharge Orders    None       Note:  This document was prepared using Dragon voice recognition software and may include unintentional dictation errors.    Alfred Levins, [place], MD 05/24/19 [phone]

## 2019-05-23 NOTE — ED Triage Notes (Addendum)
Patient c/o hematuria and slow leak of blood from tip of penis. Patient reports large clots. Patient reports that he had episode of urinary retention 1 month ago and had foley inserted. Patient followed up with urology and was dx with urinary retention and given catheters to self cath.  Patient self-cathed today, and the bleeding started after. Patient currently on Cipro for UTI

## 2019-05-24 LAB — URINALYSIS, COMPLETE (UACMP) WITH MICROSCOPIC
Bacteria, UA: NONE SEEN
Bilirubin Urine: NEGATIVE
Glucose, UA: NEGATIVE mg/dL
Ketones, ur: 5 mg/dL — AB
Nitrite: NEGATIVE
Protein, ur: 100 mg/dL — AB
RBC / HPF: >50 RBC/hpf — ABNORMAL HIGH (ref 0–5)
Specific Gravity, Urine: 1.01 (ref 1.005–1.030)
Squamous Epithelial / LPF: NONE SEEN (ref 0–5)
WBC, UA: >50 WBC/hpf — ABNORMAL HIGH (ref 0–5)
pH: 6 (ref 5.0–8.0)

## 2019-05-24 LAB — URINE CULTURE: Culture: NO GROWTH

## 2019-05-24 NOTE — ED Notes (Signed)
Patient urinated approx 15ml into urinal, scanned patient's bladder post void with result of 186 ml urine remaining.

## 2019-05-24 NOTE — ED Notes (Signed)
Patient's foley returned approx 1000 ml.

## 2019-05-24 NOTE — Discharge Instructions (Addendum)

## 2019-05-24 NOTE — ED Notes (Signed)
Patient's foley bag changed to a leg bag. Patient instructed on how to change bag if leaking occurred. Patient had blood leaking from around catheter site. MD aware.

## 2019-05-26 DIAGNOSIS — G4731 Primary central sleep apnea: Secondary | ICD-10-CM | POA: Diagnosis not present

## 2019-05-29 ENCOUNTER — Ambulatory Visit: Payer: PPO | Admitting: Podiatry

## 2019-05-29 ENCOUNTER — Other Ambulatory Visit: Payer: Self-pay

## 2019-05-29 ENCOUNTER — Encounter: Payer: Self-pay | Admitting: Podiatry

## 2019-05-29 DIAGNOSIS — L603 Nail dystrophy: Secondary | ICD-10-CM

## 2019-05-29 MED ORDER — TERBINAFINE HCL 250 MG PO TABS: 250.0000 mg | ORAL_TABLET | Freq: Every day | ORAL | 0 refills | Status: DC

## 2019-05-29 NOTE — Progress Notes (Signed)
He presents today chief complaint of thick painful nail fungus.  States that the toenail has grown out all the way and it was looking great now and it grew back and then never called to get started back on the medicine.  Now is back to ground 0.  Objective: Vital signs are stable alert and oriented x3 pulses remain palpable.  Hallux right demonstrates onychomycosis.  Painful palpation.  Hallux left demonstrates normal nail.  Assessment: Pain in limb secondary to onychomycosis.  Plan: I reviewed his E chart labs today consisting of AST and ALT.  And they were all within normal limits.  At this point I will go ahead and get him started on Lamisil 30 tablets 1 p.o. daily and I will follow-up with him in 1 month for another set of liver profile.

## 2019-06-01 DIAGNOSIS — R310 Gross hematuria: Secondary | ICD-10-CM | POA: Diagnosis not present

## 2019-06-02 DIAGNOSIS — G4731 Primary central sleep apnea: Secondary | ICD-10-CM | POA: Diagnosis not present

## 2019-06-09 ENCOUNTER — Ambulatory Visit: Payer: PPO | Admitting: Orthotics

## 2019-06-09 ENCOUNTER — Other Ambulatory Visit: Payer: Self-pay

## 2019-06-09 DIAGNOSIS — M25373 Other instability, unspecified ankle: Secondary | ICD-10-CM

## 2019-06-09 DIAGNOSIS — M2142 Flat foot [pes planus] (acquired), left foot: Secondary | ICD-10-CM

## 2019-06-09 DIAGNOSIS — M2141 Flat foot [pes planus] (acquired), right foot: Secondary | ICD-10-CM

## 2019-06-09 NOTE — Progress Notes (Signed)
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   Dawn to verify coveravge for L3000 HTA

## 2019-06-12 DIAGNOSIS — R339 Retention of urine, unspecified: Secondary | ICD-10-CM | POA: Diagnosis not present

## 2019-06-19 DIAGNOSIS — N528 Other male erectile dysfunction: Secondary | ICD-10-CM | POA: Diagnosis not present

## 2019-06-19 DIAGNOSIS — N401 Enlarged prostate with lower urinary tract symptoms: Secondary | ICD-10-CM | POA: Diagnosis not present

## 2019-06-19 DIAGNOSIS — N3941 Urge incontinence: Secondary | ICD-10-CM | POA: Diagnosis not present

## 2019-06-19 DIAGNOSIS — R972 Elevated prostate specific antigen [PSA]: Secondary | ICD-10-CM | POA: Diagnosis not present

## 2019-06-21 ENCOUNTER — Other Ambulatory Visit: Payer: PPO | Admitting: Orthotics

## 2019-06-28 ENCOUNTER — Ambulatory Visit: Payer: PPO | Admitting: Podiatry

## 2019-06-30 ENCOUNTER — Other Ambulatory Visit: Payer: PPO | Admitting: Orthotics

## 2019-07-07 ENCOUNTER — Telehealth: Payer: Self-pay | Admitting: Podiatry

## 2019-07-07 DIAGNOSIS — H52222 Regular astigmatism, left eye: Secondary | ICD-10-CM | POA: Diagnosis not present

## 2019-07-07 DIAGNOSIS — H5202 Hypermetropia, left eye: Secondary | ICD-10-CM | POA: Diagnosis not present

## 2019-07-07 DIAGNOSIS — Z961 Presence of intraocular lens: Secondary | ICD-10-CM | POA: Diagnosis not present

## 2019-07-07 DIAGNOSIS — H524 Presbyopia: Secondary | ICD-10-CM | POA: Diagnosis not present

## 2019-07-07 NOTE — Telephone Encounter (Signed)
Pt called and left message checking on status of orthotics that were ordered 11.20.20.Marland Kitchen  Received HTA auth.Marland Kitchen

## 2019-07-10 DIAGNOSIS — G4731 Primary central sleep apnea: Secondary | ICD-10-CM | POA: Diagnosis not present

## 2019-07-11 DIAGNOSIS — R339 Retention of urine, unspecified: Secondary | ICD-10-CM | POA: Diagnosis not present

## 2019-07-12 ENCOUNTER — Ambulatory Visit (INDEPENDENT_AMBULATORY_CARE_PROVIDER_SITE_OTHER): Payer: PPO | Admitting: Orthotics

## 2019-07-12 DIAGNOSIS — M25373 Other instability, unspecified ankle: Secondary | ICD-10-CM

## 2019-07-12 DIAGNOSIS — M2141 Flat foot [pes planus] (acquired), right foot: Secondary | ICD-10-CM

## 2019-07-12 DIAGNOSIS — M2142 Flat foot [pes planus] (acquired), left foot: Secondary | ICD-10-CM | POA: Diagnosis not present

## 2019-07-12 NOTE — Progress Notes (Signed)
Patient came in today to pick up custom made foot orthotics.  The goals were accomplished and the patient reported no dissatisfaction with said orthotics.  Patient was advised of breakin period and how to report any issues.Patient came in today to pick up custom made foot orthotics.  The goals were accomplished and the patient reported no dissatisfaction with said orthotics.  Patient was advised of breakin period and how to report any issues. 

## 2019-07-12 NOTE — Addendum Note (Signed)
Addended by: Velora Heckler on: 07/12/2019 03:40 PM   Modules accepted: Level of Service

## 2019-07-24 DIAGNOSIS — H35373 Puckering of macula, bilateral: Secondary | ICD-10-CM | POA: Diagnosis not present

## 2019-07-24 DIAGNOSIS — H35363 Drusen (degenerative) of macula, bilateral: Secondary | ICD-10-CM | POA: Diagnosis not present

## 2019-07-24 DIAGNOSIS — H33321 Round hole, right eye: Secondary | ICD-10-CM | POA: Diagnosis not present

## 2019-07-31 DIAGNOSIS — G4731 Primary central sleep apnea: Secondary | ICD-10-CM | POA: Diagnosis not present

## 2019-08-04 ENCOUNTER — Other Ambulatory Visit: Payer: Self-pay

## 2019-08-04 ENCOUNTER — Ambulatory Visit: Payer: PPO | Admitting: Urology

## 2019-08-04 VITALS — BP 149/80 | HR 77 | Ht 70.0 in | Wt 240.0 lb

## 2019-08-04 DIAGNOSIS — N401 Enlarged prostate with lower urinary tract symptoms: Secondary | ICD-10-CM

## 2019-08-04 DIAGNOSIS — R338 Other retention of urine: Secondary | ICD-10-CM

## 2019-08-04 NOTE — Progress Notes (Signed)
08/04/2019 8:56 AM   Jon Fischer [birth year] [id number]  Referring provider: Mayra Neer, MD [address] [place],  [place] [address]  Chief Complaint  Patient presents with  . Benign Prostatic Hypertrophy    HPI: Jon Fischer is a 79 year-old male who presents to reestablish local urologic care.  He is on standing patient Dr. Lawerance Bach in [place] and subsequently was [facility].  He has a long history of an elevated PSA with negative prostate biopsies x3 in the past.  At the time of his last biopsy prostate volume was 127 cc.   PSA values:  - 01/08/2010: 11.0  - 07/07/2010: 9.53  - 01/08/2011: 9.26  - 06/29/2011: 11.45  - 12/28/2011: 11.44  - 07/04/2012: 13.56  - 10/12/2012: 12.84  - 01/09/2013: 12.48  - 07/10/2013: 11.30  - 02/25/2015: 10.49 - 09/02/2015: 12.96 - 08/03/2016: 10.62  - 04/16/2017: 10.88  - 01/18/2018: 12.13  - 07/22/2018: 11.55 - 04/10/2019: 10.28  [place] and had a catheter placed at [facility] with an apparent volume of close to 2000 mL.  With a clinical summary as follows:  -Follow-up [facility] 10/15; started tamsulosin -Follow-up [facility] 10/19; tamsulosin increased 0.8 mg -Follow-up [facility] 10/20; fill/pull, voided 200 mL, instructed CIC -Follow-up [facility] 11/2; PVR 190 mL, treated Cipro for early epididymitis and instructed to catheterize QD -Attempted CIC 11/3 with significant bleeding; seen [facility] ED, PVR 170 mL, coud catheter placed -Follow-up [facility] 10/13; 22 F coud placed for hematuria -Follow-up [facility] 10/23; UD study:  Storage Study: The first urge to void occurred at 34cc, the normal urge to void occurred at 77cc, a strong urge to void occurred at 151cc, and the cystometric capacity was 262cc. No Uninhibited contractions noted during study.  Filling Compliance was abnormal   Pressure Flow Study:  Pressure flow study was attempted. Pt. Was unable to  void with the procedural catheter in place. Pt. Was assisted to standing and still unable to void. The procedural catheters were removed and the patient was assisted to the restroom to void.  EMG Activity: EMG Activity was Abnormal   Post Procedure Uroflow:  Patient voided 187cc. Peak urinary flow rate was: 9.6cc/sec.  The pattern was: abnormal, valsalva assisted PVR= 290cc  -Follow-up [facility] 11/30; it was recommended to keep his Foley catheter indwelling for now.  Finasteride started 11/12.  He lives in [place] and requested transfer of care locally.  He presently has no complaints.  He is tolerating his catheter fairly well.  His last voiding trial was 11/2.   PMH: Past Medical History:  Diagnosis Date  . Hypercholesteremia   . Hypertension     Surgical History: No past surgical history on file.  Home Medications:  Allergies as of 08/04/2019   No Known Allergies     Medication List       Accurate as of August 04, 2019  8:56 AM. If you have any questions, ask your nurse or doctor.        allopurinol 100 MG tablet Commonly known as: ZYLOPRIM Take by mouth.   aspirin EC 81 MG tablet Take by mouth.   ciprofloxacin 500 MG tablet Commonly known as: CIPRO Take 500 mg by mouth 2 (two) times daily.   DOCOSAHEXAENOIC ACID PO Take 2 g by mouth.   lisinopril 20 MG tablet Commonly known as: ZESTRIL   Mineral Ice 2 % Gel Generic drug: Menthol (  Topical Analgesic)   sildenafil 100 MG tablet Commonly known as: VIAGRA Take by mouth.   simvastatin 10 MG tablet Commonly known as: ZOCOR Take by mouth.   Stool Softener 100 MG capsule Generic drug: docusate sodium Take 100 mg by mouth.   tamsulosin 0.4 MG Caps capsule Commonly known as: FLOMAX Take by mouth.   terbinafine 250 MG tablet Commonly known as: LamISIL Take 1 tablet (250 mg total) by mouth daily.   verapamil 120 MG 24 hr capsule Commonly known as: VERELAN PM Take by mouth.   vitamin C 1000 MG  tablet Take 1,000 mg by mouth.   vitamin E 180 MG (400 UNITS) capsule Take by mouth.       Allergies: No Known Allergies  Family History: No family history on file.  Social History:  reports that he has been smoking cigars. He has never used smokeless tobacco. He reports current alcohol use. No history on file for drug.  ROS: UROLOGY Frequent Urination?: Yes Hard to postpone urination?: No Burning/pain with urination?: No Get up at night to urinate?: No Leakage of urine?: No Urine stream starts and stops?: Yes Trouble starting stream?: Yes Do you have to strain to urinate?: Yes Blood in urine?: No Urinary tract infection?: No Sexually transmitted disease?: No Injury to kidneys or bladder?: No Painful intercourse?: No Weak stream?: No Erection problems?: No Penile pain?: No  Gastrointestinal Nausea?: No Vomiting?: No Indigestion/heartburn?: No Diarrhea?: No Constipation?: No  Constitutional Fever: No Night sweats?: No Weight loss?: No Fatigue?: No  Skin Skin rash/lesions?: No Itching?: No  Eyes Blurred vision?: No Double vision?: No  Ears/Nose/Throat Sore throat?: No Sinus problems?: No  Hematologic/Lymphatic Swollen glands?: No Easy bruising?: No  Cardiovascular Leg swelling?: No Chest pain?: No  Respiratory Cough?: No Shortness of breath?: No  Endocrine Excessive thirst?: No  Musculoskeletal Back pain?: No Joint pain?: No  Neurological Headaches?: No Dizziness?: No  Psychologic Depression?: No Anxiety?: No  Physical Exam: BP (!) 149/80   Pulse 77   Ht 5\' 10"  (1.778 m)   Wt 240 lb (108.9 kg)   BMI 34.44 kg/m   Constitutional:  Alert and oriented, No acute distress. HEENT: [place] AT, moist mucus membranes.  Trachea midline, no masses. Cardiovascular: No clubbing, cyanosis, or edema. Respiratory: Normal respiratory effort, no increased work of breathing. Skin: No rashes, bruises or suspicious lesions. Neurologic: Grossly  intact, no focal deficits, moving all 4 extremities. Psychiatric: Normal mood and affect.   Assessment & Plan:    - Urinary retention Urodynamic study was remarkable for intact sensation and a bladder capacity of 262 mL.  Since it has been over 2 months since his last voiding trial I recommended a repeat voiding trial initially.  Since it is Friday I recommended he remove his catheter early Monday morning and return Monday afternoon for a bladder scan for residual.   Abbie Sons, [place] [address] [place], [place] [address] [phone]

## 2019-08-07 ENCOUNTER — Encounter: Payer: Self-pay | Admitting: Urology

## 2019-08-07 ENCOUNTER — Other Ambulatory Visit: Payer: Self-pay

## 2019-08-07 ENCOUNTER — Ambulatory Visit (INDEPENDENT_AMBULATORY_CARE_PROVIDER_SITE_OTHER): Payer: PPO | Admitting: Physician Assistant

## 2019-08-07 ENCOUNTER — Encounter: Payer: Self-pay | Admitting: Physician Assistant

## 2019-08-07 ENCOUNTER — Ambulatory Visit: Payer: PPO | Admitting: Physician Assistant

## 2019-08-07 VITALS — BP 135/70 | HR 83 | Ht 70.0 in | Wt 240.0 lb

## 2019-08-07 DIAGNOSIS — N529 Male erectile dysfunction, unspecified: Secondary | ICD-10-CM

## 2019-08-07 DIAGNOSIS — R338 Other retention of urine: Secondary | ICD-10-CM | POA: Diagnosis not present

## 2019-08-07 DIAGNOSIS — N401 Enlarged prostate with lower urinary tract symptoms: Secondary | ICD-10-CM

## 2019-08-07 DIAGNOSIS — N138 Other obstructive and reflux uropathy: Secondary | ICD-10-CM

## 2019-08-07 LAB — BLADDER SCAN AMB NON-IMAGING: Scan Result: 0

## 2019-08-07 MED ORDER — SILDENAFIL CITRATE 100 MG PO TABS: 100.0000 mg | ORAL_TABLET | ORAL | 2 refills | Status: DC | PRN

## 2019-08-07 NOTE — Progress Notes (Signed)
Patient presented to clinic this morning for assistance with catheter removal.  He stated that he attempted to remove his Foley catheter at home this morning.  He removed approximately 10 cc of water from his Foley catheter balloon and attempted to remove the catheter, however he felt that he was unable to remove it in its entirety.  On physical exam, nothing was found to be protruding from the urethral meatus.  Patient provided a photograph of what he was able to remove from his bladder.  He presented a photo of an intact Foley catheter with a deflated balloon attached to a drainage bag.  I explained the patient that he had successfully removed his catheter at home and no further intervention was indicated.  He expressed that he did not believe this was the entire catheter, as it was shorter than he anticipated it would be.  I reassured him that it was the entire catheter.  He expressed understanding.

## 2019-08-07 NOTE — Progress Notes (Signed)
Voiding trial follow-up  Patient removed his catheter at home this morning.  He presents to clinic this afternoon for follow-up PVR to complete a voiding trial.  He reports having drunk approximately 36 ounces of water throughout the day.  He has been able to urinate.  PVR 0 mL.   Voiding trial passed; see visit note for further instructions.

## 2019-08-07 NOTE — Progress Notes (Signed)
08/07/2019 4:55 PM   Jon Fischer Mar 21, 1941 [id number]  CC: Voiding trial  HPI: Jon Fischer is a 79 y.o. male who presents today for voiding trial.  He establish care with Dr. Bernardo Heater on 08/04/2019.  Urologic history significant for elevated PSA with negative prostate biopsies x3.  He developed acute urinary retention in October 2020 and has had a Foley catheter in place intermittently since then.  He was started on tamsulosin and finasteride in the interim.  Additionally, he previously took sildenafil 100 mg for treatment of ED.  He stopped this due to his recent urinary retention episode.  He wonders if he can restart it at this time.  He is not on nitrates.  PMH: Past Medical History:  Diagnosis Date  . Hypercholesteremia   . Hypertension     Surgical History: No past surgical history on file.  Home Medications:  Allergies as of 08/07/2019   No Known Allergies     Medication List       Accurate as of August 07, 2019  4:55 PM. If you have any questions, ask your nurse or doctor.        allopurinol 100 MG tablet Commonly known as: ZYLOPRIM Take by mouth.   aspirin EC 81 MG tablet Take by mouth.   ciprofloxacin 500 MG tablet Commonly known as: CIPRO Take 500 mg by mouth 2 (two) times daily.   DOCOSAHEXAENOIC ACID PO Take 2 g by mouth.   lisinopril 20 MG tablet Commonly known as: ZESTRIL   Mineral Ice 2 % Gel Generic drug: Menthol (Topical Analgesic)   sildenafil 100 MG tablet Commonly known as: VIAGRA Take 1 tablet (100 mg total) by mouth as needed for erectile dysfunction. What changed:   how much to take  when to take this  reasons to take this Changed by: Debroah Loop, PA-C   simvastatin 10 MG tablet Commonly known as: ZOCOR Take by mouth.   Stool Softener 100 MG capsule Generic drug: docusate sodium Take 100 mg by mouth.   tamsulosin 0.4 MG Caps capsule Commonly known as: FLOMAX Take by mouth.   terbinafine 250  MG tablet Commonly known as: LamISIL Take 1 tablet (250 mg total) by mouth daily.   verapamil 120 MG 24 hr capsule Commonly known as: VERELAN PM Take by mouth.   vitamin C 1000 MG tablet Take 1,000 mg by mouth.   vitamin E 180 MG (400 UNITS) capsule Take by mouth.       Allergies:  No Known Allergies  Family History: No family history on file.  Social History:   reports that he has been smoking cigars. He has never used smokeless tobacco. He reports current alcohol use. No history on file for drug.  ROS: UROLOGY Frequent Urination?: Yes Hard to postpone urination?: No Burning/pain with urination?: No Get up at night to urinate?: No Leakage of urine?: No Urine stream starts and stops?: Yes Trouble starting stream?: Yes Do you have to strain to urinate?: Yes Blood in urine?: No Urinary tract infection?: No Sexually transmitted disease?: No Injury to kidneys or bladder?: No Painful intercourse?: No Weak stream?: No Erection problems?: No Penile pain?: No  Gastrointestinal Nausea?: No Vomiting?: No Indigestion/heartburn?: No Diarrhea?: No Constipation?: No  Constitutional Fever: No Night sweats?: No Weight loss?: No Fatigue?: No  Skin Skin rash/lesions?: No Itching?: No  Eyes Blurred vision?: No Double vision?: No  Ears/Nose/Throat Sore throat?: No Sinus problems?: No  Hematologic/Lymphatic Swollen glands?: No Easy bruising?: No  Cardiovascular Leg swelling?: No Chest pain?: No  Respiratory Cough?: No Shortness of breath?: No  Endocrine Excessive thirst?: No  Musculoskeletal Back pain?: No Joint pain?: No  Neurological Headaches?: No Dizziness?: No  Psychologic Depression?: No Anxiety?: No  Physical Exam: BP 135/70   Pulse 83   Ht 5\' 10"  (1.778 m)   Wt 240 lb (108.9 kg)   BMI 34.44 kg/m   Constitutional:  Alert and oriented, no acute distress, nontoxic appearing HEENT: [place], AT Cardiovascular: No clubbing, cyanosis,  or edema Respiratory: Normal respiratory effort, no increased work of breathing GI: Abdomen is soft, nontender, nondistended, no abdominal masses GU: No CVA tenderness Lymph: No cervical or inguinal lymphadenopathy Skin: No rashes, bruises or suspicious lesions Neurologic: Grossly intact, no focal deficits, moving all 4 extremities Psychiatric: Normal mood and affect  Laboratory Data: Bladder Scan (Post Void Residual) in office     Status: None   Collection Time: 08/07/19  2:45 PM  Result Value Ref Range   Scan Result 0    Assessment & Plan:   1. Benign prostatic hyperplasia with urinary retention 79 year old male with recent history of acute urinary retention recently started on Flomax and finasteride presents today for voiding trial.  See separate procedure notes for details.  Voiding trial passed with PVR 0 mL.  Counseled patient to continue Flomax and finasteride and return to clinic in 3 months for symptom recheck.  I counseled the patient on signs and symptoms of urinary retention, including lower abdominal pain, lumbar pain, abdominal distention, and the inability to urinate.  I advised him to contact the office for assistance if he develops these symptoms during routine office hours, 8 AM to 5 PM Monday through Friday.  If outside those hours, I advised him to proceed to the emergency department. He expressed understanding.   2. Erectile dysfunction, unspecified erectile dysfunction type Patient previously on sildenafil 100 mg as needed for management of ED.  He stopped this in light of his recent catheterization and would like to continue it.  He is not on nitrates.  Prescription provided today in writing, per patient request. - sildenafil (VIAGRA) 100 MG tablet; Take 1 tablet (100 mg total) by mouth as needed for erectile dysfunction.  Dispense: 30 tablet; Refill: 2   Return in about 3 months (around 11/05/2019) for Symptom recheck with Dr. Bernardo Heater.  Debroah Loop,  PA-C  [facility] [address] [place], [place] [address] [phone]

## 2019-08-08 DIAGNOSIS — H35363 Drusen (degenerative) of macula, bilateral: Secondary | ICD-10-CM | POA: Diagnosis not present

## 2019-08-08 DIAGNOSIS — H35373 Puckering of macula, bilateral: Secondary | ICD-10-CM | POA: Diagnosis not present

## 2019-08-08 DIAGNOSIS — H33321 Round hole, right eye: Secondary | ICD-10-CM | POA: Diagnosis not present

## 2019-08-10 DIAGNOSIS — G4731 Primary central sleep apnea: Secondary | ICD-10-CM | POA: Diagnosis not present

## 2019-09-21 DIAGNOSIS — E782 Mixed hyperlipidemia: Secondary | ICD-10-CM | POA: Diagnosis not present

## 2019-09-21 DIAGNOSIS — I10 Essential (primary) hypertension: Secondary | ICD-10-CM | POA: Diagnosis not present

## 2019-09-26 DIAGNOSIS — R972 Elevated prostate specific antigen [PSA]: Secondary | ICD-10-CM | POA: Diagnosis not present

## 2019-09-26 DIAGNOSIS — M1990 Unspecified osteoarthritis, unspecified site: Secondary | ICD-10-CM | POA: Diagnosis not present

## 2019-09-26 DIAGNOSIS — G4733 Obstructive sleep apnea (adult) (pediatric): Secondary | ICD-10-CM | POA: Diagnosis not present

## 2019-09-26 DIAGNOSIS — Z0000 Encounter for general adult medical examination without abnormal findings: Secondary | ICD-10-CM | POA: Diagnosis not present

## 2019-09-26 DIAGNOSIS — I10 Essential (primary) hypertension: Secondary | ICD-10-CM | POA: Diagnosis not present

## 2019-09-26 DIAGNOSIS — M109 Gout, unspecified: Secondary | ICD-10-CM | POA: Diagnosis not present

## 2019-09-26 DIAGNOSIS — E782 Mixed hyperlipidemia: Secondary | ICD-10-CM | POA: Diagnosis not present

## 2019-09-26 DIAGNOSIS — Z7189 Other specified counseling: Secondary | ICD-10-CM | POA: Diagnosis not present

## 2019-11-06 ENCOUNTER — Ambulatory Visit: Payer: Self-pay | Admitting: Urology

## 2019-11-06 DIAGNOSIS — G4733 Obstructive sleep apnea (adult) (pediatric): Secondary | ICD-10-CM | POA: Diagnosis not present

## 2019-11-13 ENCOUNTER — Encounter: Payer: Self-pay | Admitting: Urology

## 2019-11-15 ENCOUNTER — Other Ambulatory Visit: Payer: Self-pay

## 2019-11-15 ENCOUNTER — Ambulatory Visit: Payer: PPO | Admitting: Urology

## 2019-11-15 VITALS — BP 133/78 | HR 67 | Ht 70.0 in | Wt 245.0 lb

## 2019-11-15 DIAGNOSIS — N138 Other obstructive and reflux uropathy: Secondary | ICD-10-CM | POA: Diagnosis not present

## 2019-11-15 DIAGNOSIS — N401 Enlarged prostate with lower urinary tract symptoms: Secondary | ICD-10-CM | POA: Diagnosis not present

## 2019-11-15 DIAGNOSIS — R972 Elevated prostate specific antigen [PSA]: Secondary | ICD-10-CM | POA: Diagnosis not present

## 2019-11-15 LAB — BLADDER SCAN AMB NON-IMAGING: Scan Result: 38

## 2019-11-15 NOTE — Progress Notes (Signed)
11/15/2019 1:41 PM   Jon Fischer 10-18-1940 [id number]  Referring provider: Mayra Neer, MD [address] [place],  [place] [address]  Chief Complaint  Patient presents with  . Follow-up    HPI: 79 y.o.male presents for follow-up of BPH and an elevated PSA.  Refer to my previous note of 08/04/2019 for a clinical summary.  He had a successful voiding trial and PVR was 0 mL.  He remains on tamsulosin and finasteride however he states he is taking finasteride twice daily.  He has noted mild increase in his ED and decreased semen on this medication.  He has stable lower urinary tract symptoms which are not bothersome.   PMH: Past Medical History:  Diagnosis Date  . Hypercholesteremia   . Hypertension     Surgical History: No past surgical history on file.  Home Medications:  Allergies as of 11/15/2019   No Known Allergies     Medication List       Accurate as of November 15, 2019  1:41 PM. If you have any questions, ask your nurse or doctor.        allopurinol 100 MG tablet Commonly known as: ZYLOPRIM Take by mouth.   aspirin EC 81 MG tablet Take by mouth.   ciprofloxacin 500 MG tablet Commonly known as: CIPRO Take 500 mg by mouth 2 (two) times daily.   DOCOSAHEXAENOIC ACID PO Take 2 g by mouth.   finasteride 5 MG tablet Commonly known as: PROSCAR Take 5 mg by mouth daily.   lisinopril 20 MG tablet Commonly known as: ZESTRIL   Mineral Ice 2 % Gel Generic drug: Menthol (Topical Analgesic)   sildenafil 100 MG tablet Commonly known as: VIAGRA Take 1 tablet (100 mg total) by mouth as needed for erectile dysfunction.   simvastatin 10 MG tablet Commonly known as: ZOCOR Take by mouth.   Stool Softener 100 MG capsule Generic drug: docusate sodium Take 100 mg by mouth.   tamsulosin 0.4 MG Caps capsule Commonly known as: FLOMAX Take by mouth.   terbinafine 250 MG tablet Commonly known as: LamISIL Take 1 tablet (250 mg total) by  mouth daily.   verapamil 120 MG 24 hr capsule Commonly known as: VERELAN PM Take by mouth.   vitamin C 1000 MG tablet Take 1,000 mg by mouth.   vitamin E 180 MG (400 UNITS) capsule Take by mouth.       Allergies: No Known Allergies  Family History: No family history on file.  Social History:  reports that he has been smoking cigars. He has never used smokeless tobacco. He reports current alcohol use. No history on file for drug.   Physical Exam: BP 133/78   Pulse 67   Ht 5\' 10"  (1.778 m)   Wt 245 lb (111.1 kg)   BMI 35.15 kg/m   Constitutional:  Alert and oriented, No acute distress. HEENT: [place] AT, moist mucus membranes.  Trachea midline, no masses. Cardiovascular: No clubbing, cyanosis, or edema. Respiratory: Normal respiratory effort, no increased work of breathing. GI: Abdomen is soft, nontender, nondistended, no abdominal masses GU: Prostate 60+ cc, smooth without nodules Skin: No rashes, bruises or suspicious lesions. Neurologic: Grossly intact, no focal deficits, moving all 4 extremities. Psychiatric: Normal mood and affect.   Assessment & Plan:    - Benign prostatic hyperplasia with urinary obstruction Stable LUTS on combination therapy.  PVR by bladder scan today was 34 mL.  Continue tamsulosin/finasteride and recommended decreasing finasteride to once daily.  Follow-up 6 months  - Elevated PSA PSA drawn today.  He was seen Dr. Rosana Hoes twice yearly and would like to continue twice yearly visits.   Abbie Sons, [place] [address], [place], [place] [address] [phone]

## 2019-11-16 LAB — PSA: Prostate Specific Ag, Serum: 10.8 ng/mL — ABNORMAL HIGH (ref 0.0–4.0)

## 2019-11-17 ENCOUNTER — Encounter: Payer: Self-pay | Admitting: Urology

## 2019-11-17 DIAGNOSIS — R972 Elevated prostate specific antigen [PSA]: Secondary | ICD-10-CM | POA: Insufficient documentation

## 2019-11-20 ENCOUNTER — Telehealth: Payer: Self-pay | Admitting: *Deleted

## 2019-11-20 NOTE — Telephone Encounter (Signed)
Pt aware of results, will keep follow up as scheduled.

## 2019-11-20 NOTE — Telephone Encounter (Signed)
-----   Message from Abbie Sons, MD sent at 11/19/2019 11:24 AM EDT ----- PSA stable at 10.8.  Follow-up as scheduled

## 2020-03-29 DIAGNOSIS — M109 Gout, unspecified: Secondary | ICD-10-CM | POA: Diagnosis not present

## 2020-03-29 DIAGNOSIS — D692 Other nonthrombocytopenic purpura: Secondary | ICD-10-CM | POA: Diagnosis not present

## 2020-03-29 DIAGNOSIS — I10 Essential (primary) hypertension: Secondary | ICD-10-CM | POA: Diagnosis not present

## 2020-03-29 DIAGNOSIS — E782 Mixed hyperlipidemia: Secondary | ICD-10-CM | POA: Diagnosis not present

## 2020-05-07 ENCOUNTER — Other Ambulatory Visit: Payer: Self-pay

## 2020-05-07 DIAGNOSIS — R972 Elevated prostate specific antigen [PSA]: Secondary | ICD-10-CM

## 2020-05-13 ENCOUNTER — Other Ambulatory Visit: Payer: Self-pay

## 2020-05-13 ENCOUNTER — Other Ambulatory Visit: Payer: PPO

## 2020-05-13 DIAGNOSIS — R972 Elevated prostate specific antigen [PSA]: Secondary | ICD-10-CM

## 2020-05-14 LAB — PSA: Prostate Specific Ag, Serum: 9.8 ng/mL — ABNORMAL HIGH (ref 0.0–4.0)

## 2020-05-15 NOTE — Progress Notes (Signed)
05/16/2020 1:22 PM   Jon Fischer [birth year] [id number]  Referring provider: Mayra Neer, MD [place] [place],  [place] [address] Chief Complaint  Patient presents with  . Benign Prostatic Hypertrophy    Urologic history: 1.  BPH with LUTS -On combination therapy tamsulosin 0.8 mg/finasteride -Episode acute urinary retention October 2020 with ~2 L of urine obtained -Started tamsulosin and post catheter removal residuals 170-190 mL w/ subsequent bladder scan residuals January 2021 and April 2021 0 mL and 38 mL respectively -Finasteride added November 2020  2.  Elevated PSA -Long history increased PSA with negative prostate biopsies x3 -PSA as high as 12.48  3.  Erectile dysfunction -On sildenafil 100 mg  HPI: Jon Fischer is a 79 y.o. male who returns for a 6 month follow up of BPH with urinary obstruction and elevated PSA.    -States he is voiding without bothersome symptoms -PSA 9.8 on 05/13/2020.  -His urinary symptoms have improved -He remains on tamsulosin BID and finasteride daily.  -Denies dysuria, gross hematuria -Denies flank, abdominal or pelvic pain  PSA trend: 12/28/2011: 11.440 07/04/2012: 13.56 10/12/2012: 12.84 01/09/2013: 12.48 07/10/2013: 11.30 02/26/2014: 11.54 08/27/2014: 13.67 02/25/2015: 10.49 09/02/2015: 12.96 04/16/2017: 10.88 10/25/2017: 15.17 01/18/2018: 12.13 04/25/2018: 14.85 07/22/2018: 11.55 04/10/2019: 10.28 11/15/2019: 10.8 (uncorrected) 05/13/2020: 9.8   (uncorrected)   PMH: Past Medical History:  Diagnosis Date  . Hypercholesteremia   . Hypertension     Surgical History: History reviewed. No pertinent surgical history.  Home Medications:  Allergies as of 05/16/2020   No Known Allergies     Medication List       Accurate as of May 16, 2020  1:22 PM. If you have any questions, ask your nurse or doctor.        STOP taking these medications   ciprofloxacin 500 MG  tablet Commonly known as: CIPRO Stopped by: Abbie Sons, MD     TAKE these medications   allopurinol 100 MG tablet Commonly known as: ZYLOPRIM Take by mouth.   aspirin EC 81 MG tablet Take by mouth.   DOCOSAHEXAENOIC ACID PO Take 2 g by mouth.   finasteride 5 MG tablet Commonly known as: PROSCAR Take 5 mg by mouth daily.   lisinopril 20 MG tablet Commonly known as: ZESTRIL   Mineral Ice 2 % Gel Generic drug: Menthol (Topical Analgesic)   sildenafil 100 MG tablet Commonly known as: VIAGRA Take 1 tablet (100 mg total) by mouth as needed for erectile dysfunction.   simvastatin 10 MG tablet Commonly known as: ZOCOR Take by mouth.   Stool Softener 100 MG capsule Generic drug: docusate sodium Take 100 mg by mouth.   tamsulosin 0.4 MG Caps capsule Commonly known as: FLOMAX Take by mouth.   terbinafine 250 MG tablet Commonly known as: LamISIL Take 1 tablet (250 mg total) by mouth daily.   verapamil 120 MG 24 hr capsule Commonly known as: VERELAN PM Take by mouth.   vitamin C 1000 MG tablet Take 1,000 mg by mouth.   vitamin E 180 MG (400 UNITS) capsule Take by mouth.       Allergies: No Known Allergies  Family History: History reviewed. No pertinent family history.  Social History:  reports that he has been smoking cigars. He has never used smokeless tobacco. He reports current alcohol use. No history on file for drug use.   Physical Exam: BP 133/76   Pulse 76   Ht 5\' 10"  (1.778 m)   Wt  240 lb (108.9 kg)   BMI 34.44 kg/m   Constitutional:  Alert and oriented, No acute distress. HEENT: [place] AT, moist mucus membranes.  Trachea midline, no masses. Cardiovascular: No clubbing, cyanosis, or edema. Respiratory: Normal respiratory effort, no increased work of breathing. GU: No CVA tenderness. 60+ g prostate, smooth no nodules/non-tender  Lymph: No cervical or inguinal lymphadenopathy. Skin: No rashes, bruises or suspicious lesions. Neurologic:  Grossly intact, no focal deficits, moving all 4 extremities. Psychiatric: Normal mood and affect.   Assessment & Plan:    1. BPH with urinary obstruction  Stable urinary symptoms on tamsulosin 0.8 mg and Finasteride daily.   Refills sent to pharmacy.   2. Elevated PSA  Patient has a history of fluctuating PSA  Recent PSA 9.8 however not corrected for finasteride  Benign DRE   3. Erectile dysfunction   Sildenafil 100 mg refilled  4. History of nephrolithiasis   Asymptomatic   Last imaging was performed over 5 years ago.   Schedule renal ultrasound    [facility] [address], [place], [place] [address] [phone]  I, Selena Batten, am acting as a scribe for Dr. John Giovanni  I have reviewed the above documentation for accuracy and completeness, and I agree with the above.    Abbie Sons, MD

## 2020-05-16 ENCOUNTER — Other Ambulatory Visit: Payer: Self-pay

## 2020-05-16 ENCOUNTER — Encounter: Payer: Self-pay | Admitting: Urology

## 2020-05-16 ENCOUNTER — Ambulatory Visit: Payer: PPO | Admitting: Urology

## 2020-05-16 VITALS — BP 133/76 | HR 76 | Ht 70.0 in | Wt 240.0 lb

## 2020-05-16 DIAGNOSIS — N401 Enlarged prostate with lower urinary tract symptoms: Secondary | ICD-10-CM | POA: Diagnosis not present

## 2020-05-16 DIAGNOSIS — R972 Elevated prostate specific antigen [PSA]: Secondary | ICD-10-CM

## 2020-05-16 DIAGNOSIS — Z87442 Personal history of urinary calculi: Secondary | ICD-10-CM | POA: Diagnosis not present

## 2020-05-16 DIAGNOSIS — N529 Male erectile dysfunction, unspecified: Secondary | ICD-10-CM | POA: Diagnosis not present

## 2020-05-16 MED ORDER — FINASTERIDE 5 MG PO TABS
5.0000 mg | ORAL_TABLET | Freq: Every day | ORAL | 3 refills | Status: DC
Start: 2020-05-16 — End: 2021-04-23

## 2020-05-16 MED ORDER — SILDENAFIL CITRATE 100 MG PO TABS: 100.0000 mg | ORAL_TABLET | ORAL | 3 refills | Status: DC | PRN

## 2020-05-16 MED ORDER — TAMSULOSIN HCL 0.4 MG PO CAPS
0.4000 mg | ORAL_CAPSULE | Freq: Two times a day (BID) | ORAL | 3 refills | Status: DC
Start: 2020-05-16 — End: 2021-04-23

## 2020-06-03 ENCOUNTER — Ambulatory Visit
Admission: RE | Admit: 2020-06-03 | Discharge: 2020-06-03 | Disposition: A | Discharge status: Home / Self Care | Payer: PPO | Source: Ambulatory Visit | Attending: Urology | Admitting: Urology

## 2020-06-03 ENCOUNTER — Other Ambulatory Visit: Payer: Self-pay

## 2020-06-03 DIAGNOSIS — Z87442 Personal history of urinary calculi: Secondary | ICD-10-CM

## 2020-06-03 DIAGNOSIS — N401 Enlarged prostate with lower urinary tract symptoms: Secondary | ICD-10-CM | POA: Insufficient documentation

## 2020-06-03 DIAGNOSIS — R39198 Other difficulties with micturition: Secondary | ICD-10-CM | POA: Insufficient documentation

## 2020-06-03 DIAGNOSIS — N3289 Other specified disorders of bladder: Secondary | ICD-10-CM | POA: Insufficient documentation

## 2020-06-03 DIAGNOSIS — R3129 Other microscopic hematuria: Secondary | ICD-10-CM | POA: Diagnosis not present

## 2020-06-05 ENCOUNTER — Encounter: Payer: Self-pay | Admitting: Urology

## 2020-06-18 DIAGNOSIS — G4731 Primary central sleep apnea: Secondary | ICD-10-CM | POA: Diagnosis not present

## 2020-08-20 DIAGNOSIS — H6122 Impacted cerumen, left ear: Secondary | ICD-10-CM | POA: Diagnosis not present

## 2020-08-27 DIAGNOSIS — H6123 Impacted cerumen, bilateral: Secondary | ICD-10-CM | POA: Diagnosis not present

## 2020-09-24 DIAGNOSIS — H35363 Drusen (degenerative) of macula, bilateral: Secondary | ICD-10-CM | POA: Diagnosis not present

## 2020-09-24 DIAGNOSIS — H35373 Puckering of macula, bilateral: Secondary | ICD-10-CM | POA: Diagnosis not present

## 2020-09-24 DIAGNOSIS — H31091 Other chorioretinal scars, right eye: Secondary | ICD-10-CM | POA: Diagnosis not present

## 2020-10-01 DIAGNOSIS — G4733 Obstructive sleep apnea (adult) (pediatric): Secondary | ICD-10-CM | POA: Diagnosis not present

## 2020-10-01 DIAGNOSIS — R972 Elevated prostate specific antigen [PSA]: Secondary | ICD-10-CM | POA: Diagnosis not present

## 2020-10-01 DIAGNOSIS — I10 Essential (primary) hypertension: Secondary | ICD-10-CM | POA: Diagnosis not present

## 2020-10-01 DIAGNOSIS — E782 Mixed hyperlipidemia: Secondary | ICD-10-CM | POA: Diagnosis not present

## 2020-10-01 DIAGNOSIS — M109 Gout, unspecified: Secondary | ICD-10-CM | POA: Diagnosis not present

## 2020-10-01 DIAGNOSIS — M1990 Unspecified osteoarthritis, unspecified site: Secondary | ICD-10-CM | POA: Diagnosis not present

## 2020-10-01 DIAGNOSIS — Z0000 Encounter for general adult medical examination without abnormal findings: Secondary | ICD-10-CM | POA: Diagnosis not present

## 2020-10-01 DIAGNOSIS — Z713 Dietary counseling and surveillance: Secondary | ICD-10-CM | POA: Diagnosis not present

## 2020-11-04 DIAGNOSIS — G4731 Primary central sleep apnea: Secondary | ICD-10-CM | POA: Diagnosis not present

## 2020-11-04 DIAGNOSIS — G4733 Obstructive sleep apnea (adult) (pediatric): Secondary | ICD-10-CM | POA: Diagnosis not present

## 2020-11-05 ENCOUNTER — Other Ambulatory Visit: Payer: Self-pay | Admitting: *Deleted

## 2020-11-05 DIAGNOSIS — R972 Elevated prostate specific antigen [PSA]: Secondary | ICD-10-CM

## 2020-11-14 ENCOUNTER — Other Ambulatory Visit: Payer: PPO

## 2020-11-14 ENCOUNTER — Other Ambulatory Visit: Payer: Self-pay

## 2020-11-14 DIAGNOSIS — R972 Elevated prostate specific antigen [PSA]: Secondary | ICD-10-CM | POA: Diagnosis not present

## 2020-11-15 ENCOUNTER — Ambulatory Visit: Payer: Self-pay | Admitting: Urology

## 2020-11-15 LAB — PSA: Prostate Specific Ag, Serum: 13 ng/mL — ABNORMAL HIGH (ref 0.0–4.0)

## 2020-11-20 ENCOUNTER — Ambulatory Visit (INDEPENDENT_AMBULATORY_CARE_PROVIDER_SITE_OTHER): Payer: PPO | Admitting: Urology

## 2020-11-20 ENCOUNTER — Encounter: Payer: Self-pay | Admitting: Urology

## 2020-11-20 ENCOUNTER — Other Ambulatory Visit: Payer: Self-pay

## 2020-11-20 VITALS — BP 122/72 | HR 64 | Ht 70.0 in | Wt 240.0 lb

## 2020-11-20 DIAGNOSIS — N401 Enlarged prostate with lower urinary tract symptoms: Secondary | ICD-10-CM | POA: Diagnosis not present

## 2020-11-20 DIAGNOSIS — N5201 Erectile dysfunction due to arterial insufficiency: Secondary | ICD-10-CM | POA: Insufficient documentation

## 2020-11-20 DIAGNOSIS — R972 Elevated prostate specific antigen [PSA]: Secondary | ICD-10-CM | POA: Diagnosis not present

## 2020-11-20 LAB — BLADDER SCAN AMB NON-IMAGING: Scan Result: 59

## 2020-11-20 NOTE — Progress Notes (Signed)
11/20/2020 7:54 AM   Jon Fischer 09/18/1940 [id number]  Referring provider: Mayra Neer, MD [address] [place],  [place] [address]  No chief complaint on file.  Urologic history: 1.  BPH with LUTS -On combination therapy tamsulosin 0.8 mg/finasteride -Episode acute urinary retention October 2020 with ~2 L of urine obtained -Started tamsulosin and post catheter removal residuals 170-190 mL w/ subsequent bladder scan residuals January 2021 and April 2021 0 mL and 38 mL respectively -Finasteride added November 2020  2.  Elevated PSA -Long history increased PSA with negative prostate biopsies x3 -PSA as high as 12.48  3.  Erectile dysfunction -On sildenafil 100 mg   HPI: 80 y.o. male presents for semiannual follow-up.   Stable voiding symptoms on combination therapy  Denies dysuria, gross hematuria  Denies flank, abdominal or pelvic pain  PSA 11/14/20 increased 13.0 (uncorrected)     PMH: Past Medical History:  Diagnosis Date  . Hypercholesteremia   . Hypertension     Surgical History: No past surgical history on file.  Home Medications:  Allergies as of 11/20/2020   No Known Allergies     Medication List       Accurate as of Nov 20, 2020  7:54 AM. If you have any questions, ask your nurse or doctor.        allopurinol 100 MG tablet Commonly known as: ZYLOPRIM Take by mouth.   aspirin EC 81 MG tablet Take by mouth.   DOCOSAHEXAENOIC ACID PO Take 2 g by mouth.   finasteride 5 MG tablet Commonly known as: PROSCAR Take 1 tablet (5 mg total) by mouth daily.   lisinopril 20 MG tablet Commonly known as: ZESTRIL   Mineral Ice 2 % Gel Generic drug: Menthol (Topical Analgesic)   sildenafil 100 MG tablet Commonly known as: VIAGRA Take 1 tablet (100 mg total) by mouth as needed for erectile dysfunction.   simvastatin 10 MG tablet Commonly known as: ZOCOR Take by mouth.   Stool Softener 100 MG capsule Generic drug:  docusate sodium Take 100 mg by mouth.   tamsulosin 0.4 MG Caps capsule Commonly known as: FLOMAX Take 1 capsule (0.4 mg total) by mouth 2 (two) times daily.   terbinafine 250 MG tablet Commonly known as: LamISIL Take 1 tablet (250 mg total) by mouth daily.   verapamil 120 MG 24 hr capsule Commonly known as: VERELAN PM Take by mouth.   vitamin C 1000 MG tablet Take 1,000 mg by mouth.   vitamin E 180 MG (400 UNITS) capsule Take by mouth.       Allergies: No Known Allergies  Family History: No family history on file.  Social History:  reports that he has been smoking cigars. He has never used smokeless tobacco. He reports current alcohol use. No history on file for drug use.   Physical Exam: There were no vitals taken for this visit.  Constitutional:  Alert and oriented, No acute distress. HEENT: [place] AT, moist mucus membranes.  Trachea midline, no masses. Cardiovascular: No clubbing, cyanosis, or edema. Respiratory: Normal respiratory effort, no increased work of breathing. GU: Prostate 60+ cc, smooth without nodules Skin: No rashes, bruises or suspicious lesions. Neurologic: Grossly intact, no focal deficits, moving all 4 extremities. Psychiatric: Normal mood and affect.   Assessment & Plan:    1.  Elevated PSA  Benign DRE  PSA bump 13.0 (26 uncorrected)  Recommended prostate MRI  The alternative of follow-up PSA in 3 months was also discussed.  He has elected the latter based on his benign DRE  Schedule lab visit 3 months for repeat PSA  2.  BPH with LUTS  Stable voiding symptoms on tamsulosin/finasteride  Bladder scan PVR today significant improvement 59 mL  He did not need med refills at this time  3.  Erectile dysfunction  Stable   Abbie Sons, MD  [facility] [address] [place], [place] [address] [phone]

## 2021-01-06 DIAGNOSIS — D485 Neoplasm of uncertain behavior of skin: Secondary | ICD-10-CM | POA: Diagnosis not present

## 2021-01-06 DIAGNOSIS — D2262 Melanocytic nevi of left upper limb, including shoulder: Secondary | ICD-10-CM | POA: Diagnosis not present

## 2021-01-06 DIAGNOSIS — D2271 Melanocytic nevi of right lower limb, including hip: Secondary | ICD-10-CM | POA: Diagnosis not present

## 2021-01-06 DIAGNOSIS — L910 Hypertrophic scar: Secondary | ICD-10-CM | POA: Diagnosis not present

## 2021-01-06 DIAGNOSIS — L538 Other specified erythematous conditions: Secondary | ICD-10-CM | POA: Diagnosis not present

## 2021-01-06 DIAGNOSIS — L820 Inflamed seborrheic keratosis: Secondary | ICD-10-CM | POA: Diagnosis not present

## 2021-01-06 DIAGNOSIS — D225 Melanocytic nevi of trunk: Secondary | ICD-10-CM | POA: Diagnosis not present

## 2021-01-06 DIAGNOSIS — L570 Actinic keratosis: Secondary | ICD-10-CM | POA: Diagnosis not present

## 2021-01-06 DIAGNOSIS — L565 Disseminated superficial actinic porokeratosis (DSAP): Secondary | ICD-10-CM | POA: Diagnosis not present

## 2021-01-06 DIAGNOSIS — L821 Other seborrheic keratosis: Secondary | ICD-10-CM | POA: Diagnosis not present

## 2021-01-06 DIAGNOSIS — D2272 Melanocytic nevi of left lower limb, including hip: Secondary | ICD-10-CM | POA: Diagnosis not present

## 2021-01-06 DIAGNOSIS — D2261 Melanocytic nevi of right upper limb, including shoulder: Secondary | ICD-10-CM | POA: Diagnosis not present

## 2021-01-07 IMAGING — US US RENAL
1 series · 15 of 25 positions shown · non-contrast
Comparison: None

CLINICAL DATA: History of nephrolithiasis in this 79-year-old male,
no reported symptoms.

EXAM:
RENAL / URINARY TRACT ULTRASOUND COMPLETE

[Series 1: us renal · 15 of 36 slices shown]
[im 1/36]
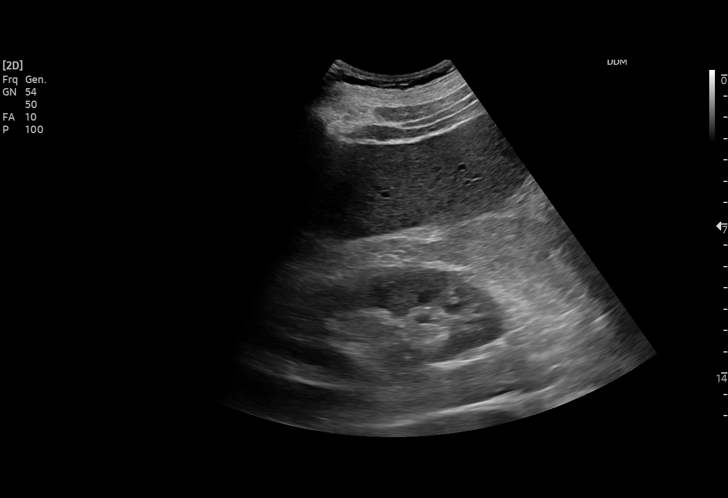
[im 3/36]
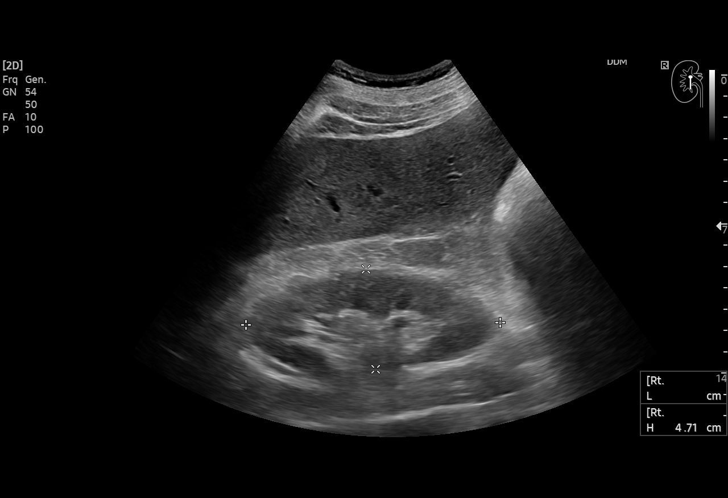
[im 6/36]
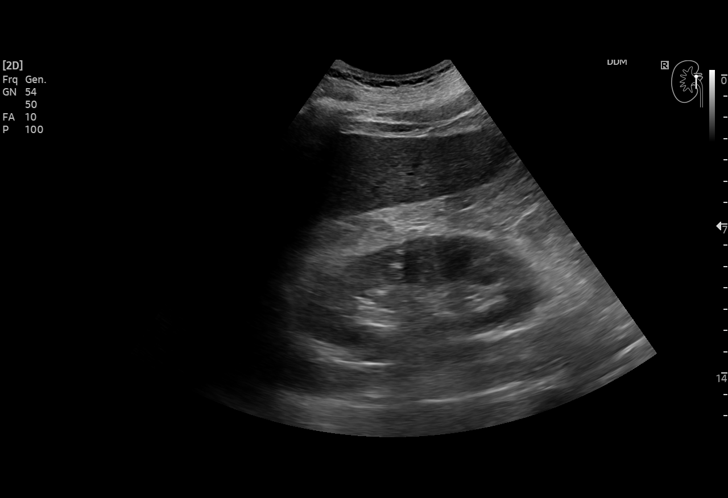
[im 8/36]
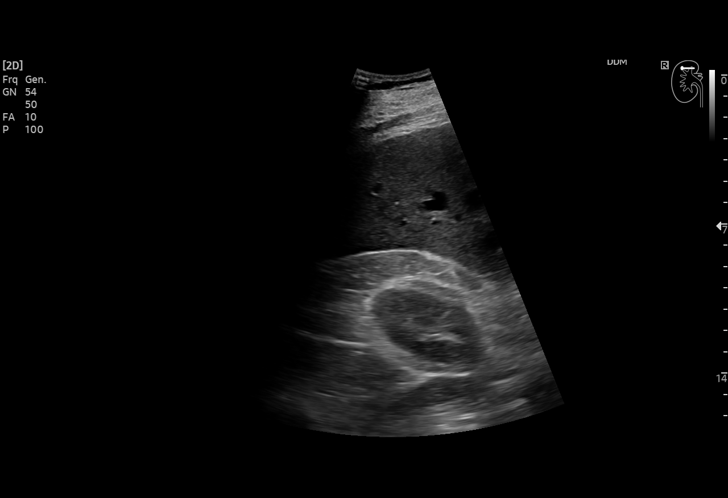
[im 11/36]
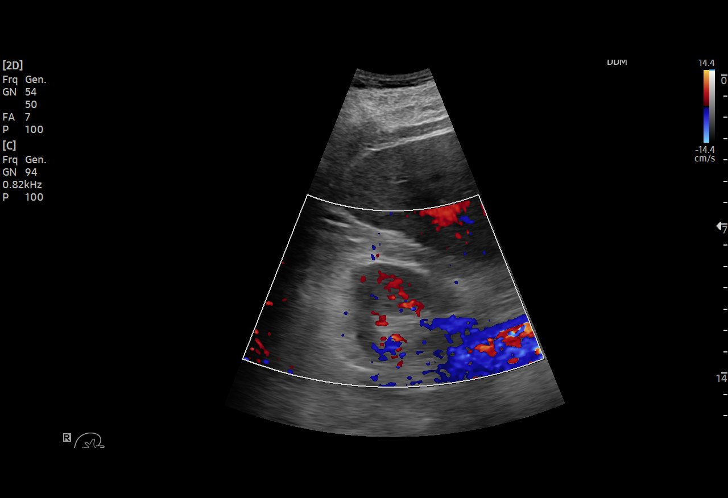
[im 14/36]
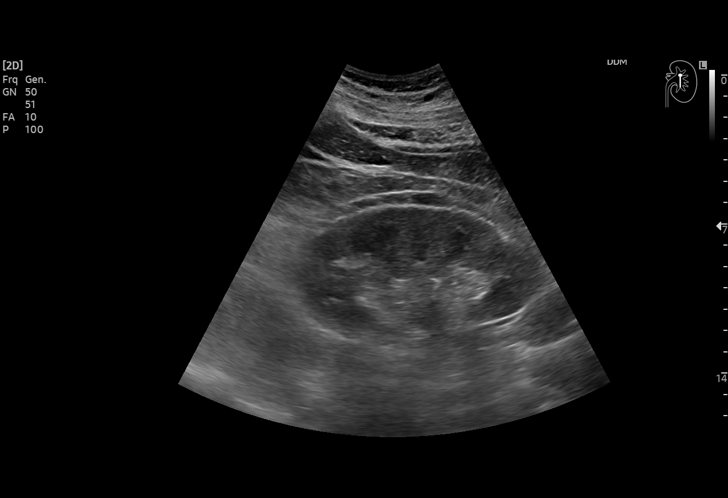
[im 15/36]
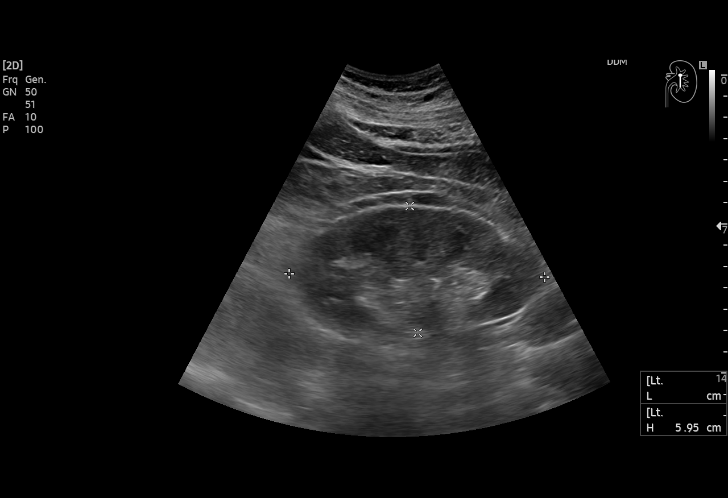
[im 18/36]
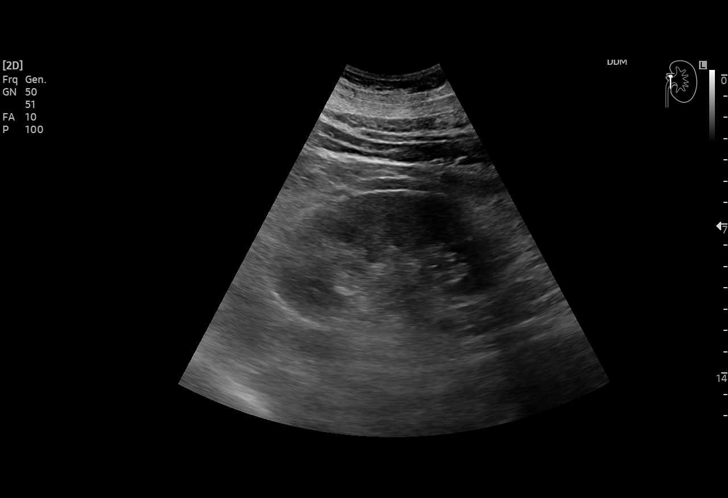
[im 21/36]
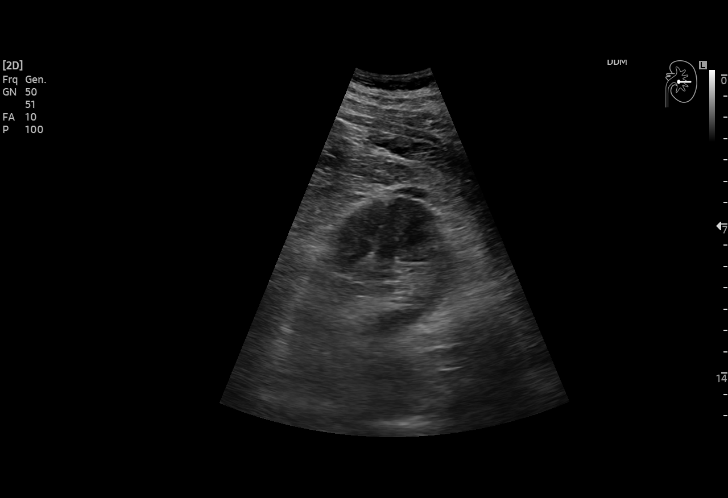
[im 22/36]
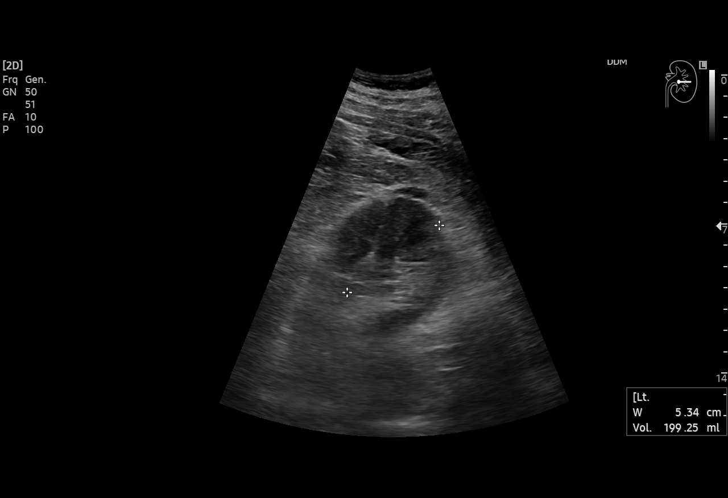
[im 25/36]
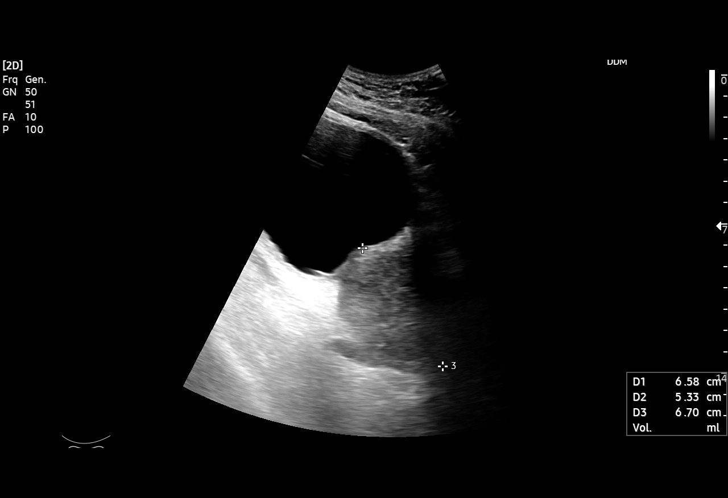
[im 28/36]
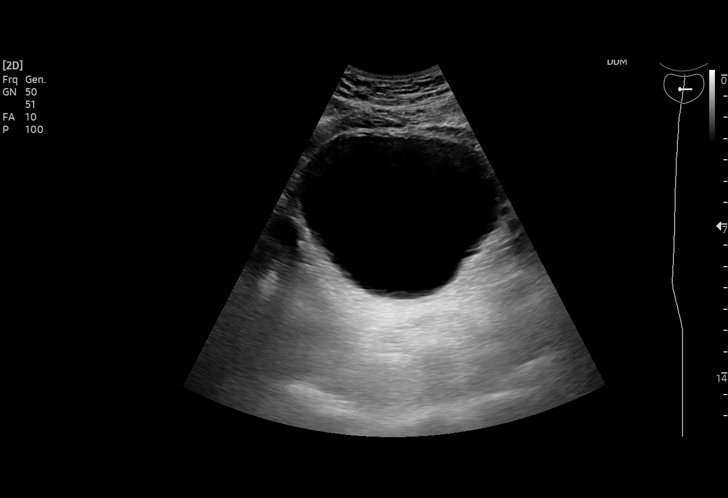
[im 30/36]
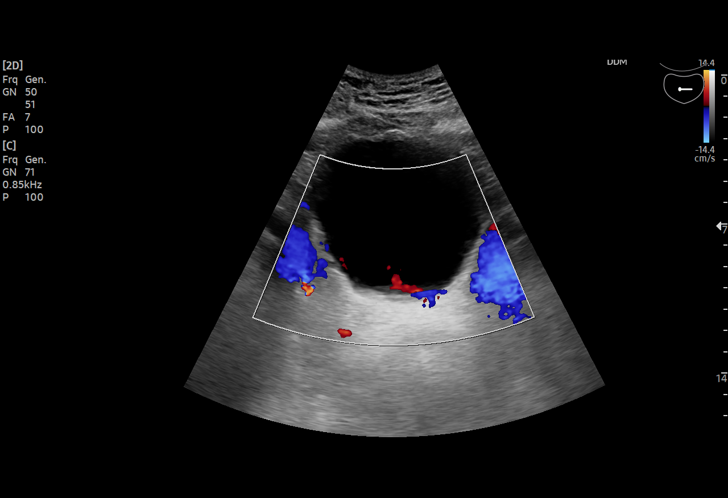
[im 33/36]
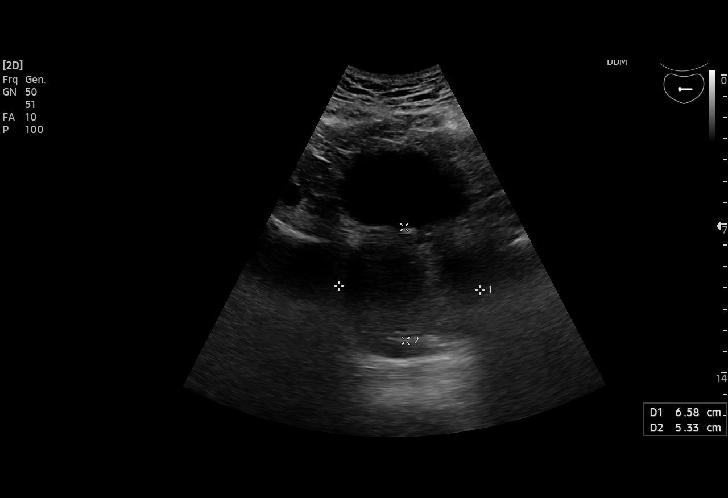
[im 36/36]
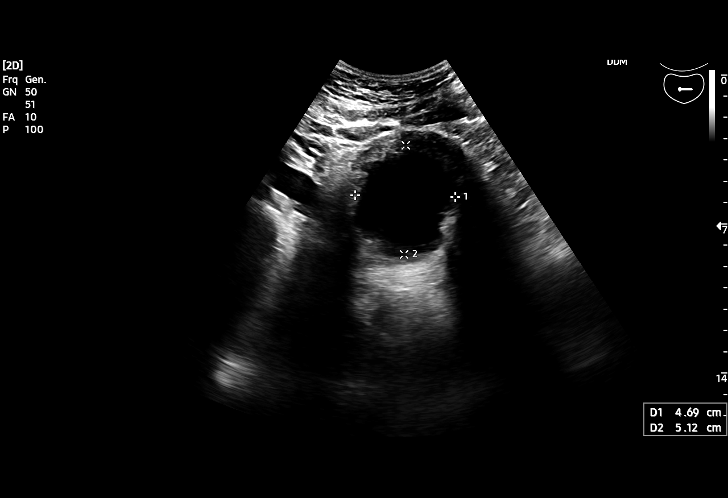

[15 of 25 positions shown; findings below may reference images not displayed]

FINDINGS: Right Kidney:

Renal measurements: 11.9 x 4.7 x 5.4 cm = volume: 158 mL.
Echogenicity within normal limits. No mass or hydronephrosis
visualized.

Left Kidney:

Renal measurements: 12.0 x 6.0 x 5.3 cm = volume: 200 mL.
Echogenicity within normal limits. No mass or hydronephrosis
visualized.

Bladder:

419 cc prevoid, postvoid with 59 cc. Prostate volume increased at
123 cc estimated.

Mildly trabeculated appearance of the urinary bladder particularly
on postvoid imaging.

Other:

None.
IMPRESSION: 1. No nephrolithiasis or hydronephrosis.
2. Prostatomegaly with mildly trabeculated appearance of the urinary
bladder raising the question of mild bladder outlet obstruction,
only small postvoid residual on current exam.

## 2021-01-28 DIAGNOSIS — L570 Actinic keratosis: Secondary | ICD-10-CM | POA: Diagnosis not present

## 2021-01-28 DIAGNOSIS — X32XXXA Exposure to sunlight, initial encounter: Secondary | ICD-10-CM | POA: Diagnosis not present

## 2021-02-20 ENCOUNTER — Other Ambulatory Visit: Payer: PPO

## 2021-02-20 ENCOUNTER — Other Ambulatory Visit: Payer: Self-pay

## 2021-02-20 DIAGNOSIS — R972 Elevated prostate specific antigen [PSA]: Secondary | ICD-10-CM | POA: Diagnosis not present

## 2021-02-21 ENCOUNTER — Telehealth: Payer: Self-pay | Admitting: Family Medicine

## 2021-02-21 LAB — PSA: Prostate Specific Ag, Serum: 8.8 ng/mL — ABNORMAL HIGH (ref 0.0–4.0)

## 2021-02-21 NOTE — Telephone Encounter (Signed)
LMOM for patient to return call.

## 2021-02-21 NOTE — Telephone Encounter (Signed)
-----   Message from Abbie Sons, MD sent at 02/21/2021  3:15 PM EDT ----- Repeat PSA back to baseline at 8.8.  Please schedule 53-monthfollow-up office visit with PSA prior

## 2021-02-21 NOTE — Telephone Encounter (Signed)
Spoke with patient and advised results  Lab appt scheduled  

## 2021-04-09 DIAGNOSIS — Z713 Dietary counseling and surveillance: Secondary | ICD-10-CM | POA: Diagnosis not present

## 2021-04-09 DIAGNOSIS — E782 Mixed hyperlipidemia: Secondary | ICD-10-CM | POA: Diagnosis not present

## 2021-04-09 DIAGNOSIS — I10 Essential (primary) hypertension: Secondary | ICD-10-CM | POA: Diagnosis not present

## 2021-04-09 DIAGNOSIS — M109 Gout, unspecified: Secondary | ICD-10-CM | POA: Diagnosis not present

## 2021-04-23 ENCOUNTER — Other Ambulatory Visit: Payer: Self-pay

## 2021-04-23 ENCOUNTER — Other Ambulatory Visit: Payer: Self-pay | Admitting: Family Medicine

## 2021-04-23 MED ORDER — TAMSULOSIN HCL 0.4 MG PO CAPS: 0.4000 mg | ORAL_CAPSULE | Freq: Two times a day (BID) | ORAL | 3 refills | Status: DC

## 2021-04-23 MED ORDER — FINASTERIDE 5 MG PO TABS: 5.0000 mg | ORAL_TABLET | Freq: Every day | ORAL | 3 refills | Status: DC

## 2021-04-23 NOTE — Telephone Encounter (Signed)
Pt called requesting refill on flomax and finasteride sent to mail away pharmacy

## 2021-08-22 ENCOUNTER — Other Ambulatory Visit: Payer: Self-pay

## 2021-08-22 DIAGNOSIS — R972 Elevated prostate specific antigen [PSA]: Secondary | ICD-10-CM

## 2021-08-25 ENCOUNTER — Other Ambulatory Visit: Payer: Self-pay

## 2021-08-25 ENCOUNTER — Other Ambulatory Visit: Payer: PPO

## 2021-08-25 DIAGNOSIS — R972 Elevated prostate specific antigen [PSA]: Secondary | ICD-10-CM

## 2021-08-26 LAB — PSA: Prostate Specific Ag, Serum: 10.5 ng/mL — ABNORMAL HIGH (ref 0.0–4.0)

## 2021-08-27 ENCOUNTER — Other Ambulatory Visit: Payer: Self-pay

## 2021-08-27 ENCOUNTER — Ambulatory Visit: Payer: PPO | Admitting: Urology

## 2021-08-27 ENCOUNTER — Encounter: Payer: Self-pay | Admitting: Urology

## 2021-08-27 VITALS — BP 125/75 | HR 67 | Ht 70.0 in | Wt 240.0 lb

## 2021-08-27 DIAGNOSIS — R972 Elevated prostate specific antigen [PSA]: Secondary | ICD-10-CM

## 2021-08-27 DIAGNOSIS — N5201 Erectile dysfunction due to arterial insufficiency: Secondary | ICD-10-CM | POA: Diagnosis not present

## 2021-08-27 MED ORDER — SILDENAFIL CITRATE 100 MG PO TABS: 100.0000 mg | ORAL_TABLET | ORAL | 3 refills | Status: DC | PRN

## 2021-08-27 NOTE — Progress Notes (Signed)
08/27/2021 10:10 AM   Jon Fischer 29-Jun-1941 [id number]  Referring provider: Mayra Neer, MD [address] [place],  [place] [address]  Chief Complaint  Patient presents with   Elevated PSA    Urologic history: 1.  BPH with LUTS -On combination therapy tamsulosin 0.8 mg/finasteride -Episode acute urinary retention October 2020 with ~2 L of urine obtained -Started tamsulosin and post catheter removal residuals 170-190 mL w/ subsequent bladder scan residuals January 2021 and April 2021 0 mL and 38 mL respectively -Finasteride added November 2020   2.  Elevated PSA -Long history increased PSA with negative prostate biopsies x3 -PSA as high as 12.48   3.  Erectile dysfunction -On sildenafil 100 mg    HPI: 81 y.o. male presents for follow-up visit.  Last seen 11/20/2020 PSA bump to 13 (uncorrected) at that visit.  He declined prostate MRI and elected a follow-up PSA which was back to baseline at 8.8 drawn August 2022 No bothersome LUTS Denies dysuria, gross hematuria No flank, abdominal or pelvic pain   PMH: Past Medical History:  Diagnosis Date   Hypercholesteremia    Hypertension     Surgical History: History reviewed. No pertinent surgical history.  Home Medications:  Allergies as of 08/27/2021   No Known Allergies      Medication List        Accurate as of August 27, 2021 10:10 AM. If you have any questions, ask your nurse or doctor.          allopurinol 100 MG tablet Commonly known as: ZYLOPRIM Take by mouth.   aspirin EC 81 MG tablet Take by mouth.   DOCOSAHEXAENOIC ACID PO Take 2 g by mouth.   finasteride 5 MG tablet Commonly known as: PROSCAR Take 1 tablet (5 mg total) by mouth daily.   lisinopril 20 MG tablet Commonly known as: ZESTRIL   Mineral Ice 2 % Gel Generic drug: Menthol (Topical Analgesic)   sildenafil 100 MG tablet Commonly known as: VIAGRA Take 1 tablet (100 mg total) by mouth as needed for  erectile dysfunction.   simvastatin 10 MG tablet Commonly known as: ZOCOR Take by mouth.   Stool Softener 100 MG capsule Generic drug: docusate sodium Take 100 mg by mouth.   tamsulosin 0.4 MG Caps capsule Commonly known as: FLOMAX Take 1 capsule (0.4 mg total) by mouth 2 (two) times daily.   terbinafine 250 MG tablet Commonly known as: LamISIL Take 1 tablet (250 mg total) by mouth daily.   verapamil 120 MG 24 hr capsule Commonly known as: VERELAN PM Take by mouth.   vitamin C 1000 MG tablet Take 1,000 mg by mouth.   vitamin E 180 MG (400 UNITS) capsule Take by mouth.        Allergies: No Known Allergies  Family History: History reviewed. No pertinent family history.  Social History:  reports that he has been smoking cigars. He has never used smokeless tobacco. He reports current alcohol use. No history on file for drug use.   Physical Exam: BP 125/75    Pulse 67    Ht 5\' 10"  (1.778 m)    Wt 240 lb (108.9 kg)    BMI 34.44 kg/m   Constitutional:  Alert and oriented, No acute distress. HEENT:  AT, moist mucus membranes.  Trachea midline, no masses. Cardiovascular: No clubbing, cyanosis, or edema. Respiratory: Normal respiratory effort, no increased work of breathing. GU: Prostate 60+ cc, smooth without nodules Skin: No rashes, bruises or  suspicious lesions. Psychiatric: Normal mood and affect.   Assessment & Plan:    1.  Elevated PSA PSA within baseline and benign DRE Options of surveillance and prostate MRI were discussed.  He has elected to continue surveillance Schedule 47-month follow-up appointment with PSA  2.  Erectile dysfunction Stable Requested sildenafil refill   Abbie Sons, MD  [facility] [address] [name],  [address] [phone]

## 2021-10-06 ENCOUNTER — Telehealth: Payer: Self-pay

## 2021-10-06 DIAGNOSIS — N5201 Erectile dysfunction due to arterial insufficiency: Secondary | ICD-10-CM

## 2021-10-06 MED ORDER — SILDENAFIL CITRATE 100 MG PO TABS: 100.0000 mg | ORAL_TABLET | ORAL | 6 refills | Status: AC | PRN

## 2021-10-06 NOTE — Telephone Encounter (Signed)
Incoming call from pt who requests RX for sildenafil sent to [facility] order pharmacy, RX sent.  ?

## 2021-10-09 ENCOUNTER — Telehealth: Payer: Self-pay | Admitting: *Deleted

## 2021-10-09 NOTE — Telephone Encounter (Signed)
Talked with patient and he states its all worked out . They are sending medication .  ?

## 2021-10-09 NOTE — Telephone Encounter (Signed)
Received call from Loraine from [facility] regarding clarification on RX sildenafil-clarify SIG and quantity. Please return call [phone] ?

## 2021-10-10 ENCOUNTER — Telehealth: Payer: Self-pay | Admitting: Urology

## 2021-10-10 NOTE — Telephone Encounter (Signed)
Patient called regarding previous phone message:  Received call from Berino from [facility] regarding clarification on RX sildenafil-clarify SIG and quantity. Please return call [phone] ? ? ?Jon Fischer states, frequency is as needed. Supply is 90 day. ?

## 2021-10-13 DIAGNOSIS — Z23 Encounter for immunization: Secondary | ICD-10-CM | POA: Diagnosis not present

## 2021-10-13 DIAGNOSIS — Z0000 Encounter for general adult medical examination without abnormal findings: Secondary | ICD-10-CM | POA: Diagnosis not present

## 2021-10-13 DIAGNOSIS — E782 Mixed hyperlipidemia: Secondary | ICD-10-CM | POA: Diagnosis not present

## 2021-10-13 DIAGNOSIS — G4733 Obstructive sleep apnea (adult) (pediatric): Secondary | ICD-10-CM | POA: Diagnosis not present

## 2021-10-13 DIAGNOSIS — R7303 Prediabetes: Secondary | ICD-10-CM | POA: Diagnosis not present

## 2021-10-13 DIAGNOSIS — M1990 Unspecified osteoarthritis, unspecified site: Secondary | ICD-10-CM | POA: Diagnosis not present

## 2021-10-13 DIAGNOSIS — R972 Elevated prostate specific antigen [PSA]: Secondary | ICD-10-CM | POA: Diagnosis not present

## 2021-10-13 DIAGNOSIS — M109 Gout, unspecified: Secondary | ICD-10-CM | POA: Diagnosis not present

## 2021-10-13 DIAGNOSIS — I10 Essential (primary) hypertension: Secondary | ICD-10-CM | POA: Diagnosis not present

## 2021-10-15 ENCOUNTER — Telehealth: Payer: Self-pay

## 2021-10-15 NOTE — Telephone Encounter (Signed)
Incoming VM on triage line from pt in regards to ED meds. Pt is requesting 90 day supply of sildenafil. Per Janett Billow- we do not give 90 day supply of ED meds that are to be taken on an as needed basis.  ?

## 2021-11-12 DIAGNOSIS — G4733 Obstructive sleep apnea (adult) (pediatric): Secondary | ICD-10-CM | POA: Diagnosis not present

## 2022-01-06 DIAGNOSIS — L565 Disseminated superficial actinic porokeratosis (DSAP): Secondary | ICD-10-CM | POA: Diagnosis not present

## 2022-01-06 DIAGNOSIS — D2261 Melanocytic nevi of right upper limb, including shoulder: Secondary | ICD-10-CM | POA: Diagnosis not present

## 2022-01-06 DIAGNOSIS — D2262 Melanocytic nevi of left upper limb, including shoulder: Secondary | ICD-10-CM | POA: Diagnosis not present

## 2022-01-06 DIAGNOSIS — D225 Melanocytic nevi of trunk: Secondary | ICD-10-CM | POA: Diagnosis not present

## 2022-01-06 DIAGNOSIS — L570 Actinic keratosis: Secondary | ICD-10-CM | POA: Diagnosis not present

## 2022-01-06 DIAGNOSIS — L821 Other seborrheic keratosis: Secondary | ICD-10-CM | POA: Diagnosis not present

## 2022-02-26 ENCOUNTER — Other Ambulatory Visit: Payer: PPO

## 2022-02-26 ENCOUNTER — Other Ambulatory Visit: Payer: Self-pay

## 2022-02-26 DIAGNOSIS — R972 Elevated prostate specific antigen [PSA]: Secondary | ICD-10-CM

## 2022-02-27 ENCOUNTER — Ambulatory Visit: Payer: PPO | Admitting: Urology

## 2022-02-27 ENCOUNTER — Encounter: Payer: Self-pay | Admitting: Urology

## 2022-02-27 VITALS — BP 158/75 | HR 56 | Ht 70.0 in | Wt 235.0 lb

## 2022-02-27 DIAGNOSIS — R972 Elevated prostate specific antigen [PSA]: Secondary | ICD-10-CM

## 2022-02-27 DIAGNOSIS — N401 Enlarged prostate with lower urinary tract symptoms: Secondary | ICD-10-CM

## 2022-02-27 DIAGNOSIS — N529 Male erectile dysfunction, unspecified: Secondary | ICD-10-CM

## 2022-02-27 LAB — PSA: Prostate Specific Ag, Serum: 10.3 ng/mL — ABNORMAL HIGH (ref 0.0–4.0)

## 2022-02-27 NOTE — Progress Notes (Signed)
02/27/2022 11:35 AM   Jon Fischer 01/23/1941 [id number]  Referring provider: Mayra Neer, MD [address] [place],  [place] [address]  Chief Complaint  Patient presents with   Elevated PSA    Urologic history: 1.  BPH with LUTS -On combination therapy tamsulosin 0.8 mg/finasteride -Episode acute urinary retention October 2020 with ~2 L of urine obtained -Started tamsulosin and post catheter removal residuals 170-190 mL w/ subsequent bladder scan residuals January 2021 and April 2021 0 mL and 38 mL respectively -Finasteride added November 2020   2.  Elevated PSA -Long history increased PSA with negative prostate biopsies x3 -PSA as high as 12.48   3.  Erectile dysfunction -On sildenafil 100 mg    HPI: 81 y.o. male presents for follow-up visit.  Doing well since last visit No bothersome LUTS Remains on tamsulosin/finasteride Denies dysuria, gross hematuria Denies flank, abdominal or pelvic pain PSA 02/26/2022 stable 10.3  PMH: Past Medical History:  Diagnosis Date   Hypercholesteremia    Hypertension     Surgical History: No past surgical history on file.  Home Medications:  Allergies as of 02/27/2022   No Known Allergies      Medication List        Accurate as of February 27, 2022 11:35 AM. If you have any questions, ask your nurse or doctor.          allopurinol 100 MG tablet Commonly known as: ZYLOPRIM Take by mouth.   aspirin EC 81 MG tablet Take by mouth.   DOCOSAHEXAENOIC ACID PO Take 2 g by mouth.   finasteride 5 MG tablet Commonly known as: PROSCAR Take 1 tablet (5 mg total) by mouth daily.   lisinopril 20 MG tablet Commonly known as: ZESTRIL   Mineral Ice 2 % Gel Generic drug: Menthol (Topical Analgesic)   sildenafil 100 MG tablet Commonly known as: VIAGRA Take 1 tablet (100 mg total) by mouth as needed for erectile dysfunction.   simvastatin 10 MG tablet Commonly known as: ZOCOR Take by mouth.    Stool Softener 100 MG capsule Generic drug: docusate sodium Take 100 mg by mouth.   tamsulosin 0.4 MG Caps capsule Commonly known as: FLOMAX Take 1 capsule (0.4 mg total) by mouth 2 (two) times daily.   terbinafine 250 MG tablet Commonly known as: LamISIL Take 1 tablet (250 mg total) by mouth daily.   verapamil 120 MG 24 hr capsule Commonly known as: VERELAN PM Take by mouth.   vitamin C 1000 MG tablet Take 1,000 mg by mouth.   vitamin E 180 MG (400 UNITS) capsule Take by mouth.        Allergies: No Known Allergies  Family History: No family history on file.  Social History:  reports that he has been smoking cigars. He has never used smokeless tobacco. He reports current alcohol use. No history on file for drug use.   Physical Exam: BP (!) 158/75   Pulse (!) 56   Ht '5\' 10"'$  (1.778 m)   Wt 235 lb (106.6 kg)   BMI 33.72 kg/m   Constitutional:  Alert and oriented, No acute distress. HEENT: [place] AT, moist mucus membranes.  Trachea midline, no masses. Cardiovascular: No clubbing, cyanosis, or edema. Respiratory: Normal respiratory effort, no increased work of breathing. GU: Prostate 60+ cc, smooth without nodules Skin: No rashes, bruises or suspicious lesions. Psychiatric: Normal mood and affect.   Assessment & Plan:    1.  Elevated PSA PSA within baseline and  benign DRE Lab visit 6 months PSA Office visit 1 year PSA/DRE  2.  BPH with LUTS Stable on tamsulosin/finasteride   Abbie Sons, MD  [facility] [address] [place], [place] [address] [phone]

## 2022-03-02 DIAGNOSIS — G4731 Primary central sleep apnea: Secondary | ICD-10-CM | POA: Diagnosis not present

## 2022-03-02 DIAGNOSIS — G4733 Obstructive sleep apnea (adult) (pediatric): Secondary | ICD-10-CM | POA: Diagnosis not present

## 2022-03-24 ENCOUNTER — Other Ambulatory Visit: Payer: Self-pay | Admitting: Family Medicine

## 2022-03-24 MED ORDER — TAMSULOSIN HCL 0.4 MG PO CAPS: 0.4000 mg | ORAL_CAPSULE | Freq: Two times a day (BID) | ORAL | 3 refills | Status: DC

## 2022-04-20 DIAGNOSIS — E782 Mixed hyperlipidemia: Secondary | ICD-10-CM | POA: Diagnosis not present

## 2022-04-20 DIAGNOSIS — Z23 Encounter for immunization: Secondary | ICD-10-CM | POA: Diagnosis not present

## 2022-04-20 DIAGNOSIS — Z6835 Body mass index (BMI) 35.0-35.9, adult: Secondary | ICD-10-CM | POA: Diagnosis not present

## 2022-04-20 DIAGNOSIS — E669 Obesity, unspecified: Secondary | ICD-10-CM | POA: Diagnosis not present

## 2022-04-20 DIAGNOSIS — I10 Essential (primary) hypertension: Secondary | ICD-10-CM | POA: Diagnosis not present

## 2022-04-20 DIAGNOSIS — R7303 Prediabetes: Secondary | ICD-10-CM | POA: Diagnosis not present

## 2022-06-04 ENCOUNTER — Other Ambulatory Visit: Payer: Self-pay | Admitting: Family Medicine

## 2022-06-04 MED ORDER — FINASTERIDE 5 MG PO TABS: 5.0000 mg | ORAL_TABLET | Freq: Every day | ORAL | 3 refills | Status: DC

## 2022-08-31 ENCOUNTER — Other Ambulatory Visit: Payer: Self-pay

## 2022-08-31 DIAGNOSIS — R972 Elevated prostate specific antigen [PSA]: Secondary | ICD-10-CM

## 2022-08-31 DIAGNOSIS — N401 Enlarged prostate with lower urinary tract symptoms: Secondary | ICD-10-CM

## 2022-09-01 ENCOUNTER — Other Ambulatory Visit: Payer: PPO

## 2022-09-01 DIAGNOSIS — R972 Elevated prostate specific antigen [PSA]: Secondary | ICD-10-CM | POA: Diagnosis not present

## 2022-09-02 ENCOUNTER — Encounter: Payer: Self-pay | Admitting: *Deleted

## 2022-09-02 LAB — PSA: Prostate Specific Ag, Serum: 10.7 ng/mL — ABNORMAL HIGH (ref 0.0–4.0)

## 2022-10-21 DIAGNOSIS — Z23 Encounter for immunization: Secondary | ICD-10-CM | POA: Diagnosis not present

## 2022-10-21 DIAGNOSIS — E782 Mixed hyperlipidemia: Secondary | ICD-10-CM | POA: Diagnosis not present

## 2022-10-21 DIAGNOSIS — R7303 Prediabetes: Secondary | ICD-10-CM | POA: Diagnosis not present

## 2022-10-21 DIAGNOSIS — Z9181 History of falling: Secondary | ICD-10-CM | POA: Diagnosis not present

## 2022-10-21 DIAGNOSIS — Z0000 Encounter for general adult medical examination without abnormal findings: Secondary | ICD-10-CM | POA: Diagnosis not present

## 2022-10-21 DIAGNOSIS — I10 Essential (primary) hypertension: Secondary | ICD-10-CM | POA: Diagnosis not present

## 2022-10-21 DIAGNOSIS — M109 Gout, unspecified: Secondary | ICD-10-CM | POA: Diagnosis not present

## 2022-10-21 DIAGNOSIS — M1990 Unspecified osteoarthritis, unspecified site: Secondary | ICD-10-CM | POA: Diagnosis not present

## 2022-10-21 DIAGNOSIS — R972 Elevated prostate specific antigen [PSA]: Secondary | ICD-10-CM | POA: Diagnosis not present

## 2022-10-21 DIAGNOSIS — G4733 Obstructive sleep apnea (adult) (pediatric): Secondary | ICD-10-CM | POA: Diagnosis not present

## 2022-10-21 DIAGNOSIS — E669 Obesity, unspecified: Secondary | ICD-10-CM | POA: Diagnosis not present

## 2022-11-19 DIAGNOSIS — G4733 Obstructive sleep apnea (adult) (pediatric): Secondary | ICD-10-CM | POA: Diagnosis not present

## 2023-01-12 DIAGNOSIS — D225 Melanocytic nevi of trunk: Secondary | ICD-10-CM | POA: Diagnosis not present

## 2023-01-12 DIAGNOSIS — L573 Poikiloderma of Civatte: Secondary | ICD-10-CM | POA: Diagnosis not present

## 2023-01-12 DIAGNOSIS — R208 Other disturbances of skin sensation: Secondary | ICD-10-CM | POA: Diagnosis not present

## 2023-01-12 DIAGNOSIS — D2262 Melanocytic nevi of left upper limb, including shoulder: Secondary | ICD-10-CM | POA: Diagnosis not present

## 2023-01-12 DIAGNOSIS — L538 Other specified erythematous conditions: Secondary | ICD-10-CM | POA: Diagnosis not present

## 2023-01-12 DIAGNOSIS — L820 Inflamed seborrheic keratosis: Secondary | ICD-10-CM | POA: Diagnosis not present

## 2023-01-12 DIAGNOSIS — D2261 Melanocytic nevi of right upper limb, including shoulder: Secondary | ICD-10-CM | POA: Diagnosis not present

## 2023-02-23 ENCOUNTER — Other Ambulatory Visit: Payer: PPO

## 2023-02-23 DIAGNOSIS — R972 Elevated prostate specific antigen [PSA]: Secondary | ICD-10-CM

## 2023-02-25 ENCOUNTER — Ambulatory Visit: Payer: PPO | Admitting: Urology

## 2023-02-25 ENCOUNTER — Encounter: Payer: Self-pay | Admitting: Urology

## 2023-02-25 VITALS — BP 115/71 | HR 60 | Ht 70.0 in | Wt 250.0 lb

## 2023-02-25 DIAGNOSIS — N401 Enlarged prostate with lower urinary tract symptoms: Secondary | ICD-10-CM | POA: Diagnosis not present

## 2023-02-25 DIAGNOSIS — R972 Elevated prostate specific antigen [PSA]: Secondary | ICD-10-CM

## 2023-02-25 LAB — BLADDER SCAN AMB NON-IMAGING

## 2023-02-25 NOTE — Progress Notes (Signed)
I,Dina M Abdulla,acting as a scribe for Riki Altes, MD.,have documented all relevant documentation on the behalf of Riki Altes, MD,as directed by  Riki Altes, MD while in the presence of Riki Altes, MD.  02/25/2023 2:54 PM   Jon Fischer [birth year] [id number]  Referring provider: Lupita Raider, MD [address] [place],  [place] [address]  Chief Complaint  Patient presents with   Follow-up    Urologic history:  1.  BPH with LUTS -On combination therapy tamsulosin 0.8 mg/finasteride -Episode acute urinary retention October 2020 with ~2 L of urine obtained -Started tamsulosin and post catheter removal residuals 170-190 mL w/ subsequent bladder scan residuals January 2021 and April 2021 0 mL and 38 mL respectively -Finasteride added November 2020   2.  Elevated PSA -Long history increased PSA with negative prostate biopsies x3 -PSA as high as 12.48   3.  Erectile dysfunction -On sildenafil 100 mg    HPI: 82 y.o. male presents for follow-up visit.  Doing well since last visit No bothersome LUTS Remains on tamsulosin/finasteride Denies dysuria, gross hematuria Denies flank, abdominal or pelvic pain PSA 02/23/2023 was 11.8   PMH: Past Medical History:  Diagnosis Date   Hypercholesteremia    Hypertension      Home Medications:  Allergies as of 02/25/2023   No Known Allergies      Medication List        Accurate as of February 25, 2023  2:54 PM. If you have any questions, ask your nurse or doctor.          STOP taking these medications    DOCOSAHEXAENOIC ACID PO Stopped by: Riki Altes       TAKE these medications    allopurinol 100 MG tablet Commonly known as: ZYLOPRIM Take by mouth.   aspirin EC 81 MG tablet Take by mouth.   finasteride 5 MG tablet Commonly known as: PROSCAR Take 1 tablet (5 mg total) by mouth daily.   lisinopril 20 MG tablet Commonly known as: ZESTRIL   Mineral Ice 2 %  Gel Generic drug: Menthol (Topical Analgesic)   sildenafil 100 MG tablet Commonly known as: VIAGRA Take 1 tablet (100 mg total) by mouth as needed for erectile dysfunction.   simvastatin 10 MG tablet Commonly known as: ZOCOR Take by mouth.   Stool Softener 100 MG capsule Generic drug: docusate sodium Take 100 mg by mouth.   tamsulosin 0.4 MG Caps capsule Commonly known as: FLOMAX Take 1 capsule (0.4 mg total) by mouth 2 (two) times daily.   terbinafine 250 MG tablet Commonly known as: LamISIL Take 1 tablet (250 mg total) by mouth daily.   verapamil 120 MG 24 hr capsule Commonly known as: VERELAN Take by mouth.   vitamin C 1000 MG tablet Take 1,000 mg by mouth.   vitamin E 180 MG (400 UNITS) capsule Take by mouth.        Social History:  reports that he has been smoking cigars. He has never used smokeless tobacco. He reports current alcohol use. No history on file for drug use.   Physical Exam: BP 115/71   Pulse 60   Ht 5\' 10"  (1.778 m)   Wt 250 lb (113.4 kg)   BMI 35.87 kg/m   Constitutional:  Alert and oriented, No acute distress. HEENT: [place] AT, moist mucus membranes.  Trachea midline, no masses. Cardiovascular: No clubbing, cyanosis, or edema. Respiratory: Normal respiratory effort, no increased work of breathing.  GU: Prostate 60 g, smooth without nodules Skin: No rashes, bruises or suspicious lesions. Psychiatric: Normal mood and affect.   Assessment & Plan:    1.  Elevated PSA Slight PSA bump to 11.8, though his PSA has been as high as 13 in 2022 Lab visit 6 months PSA Office visit 1 year PSA/DRE  2.  BPH with LUTS Stable on tamsulosin/finasteride; he did not need refils PVR today 32 mL   I have reviewed the above documentation for accuracy and completeness, and I agree with the above.   Riki Altes, MD  [facility] [address], [address] [place], [place] [address] [phone]

## 2023-02-26 ENCOUNTER — Ambulatory Visit: Payer: PPO | Admitting: Urology

## 2023-03-31 DIAGNOSIS — Z23 Encounter for immunization: Secondary | ICD-10-CM | POA: Diagnosis not present

## 2023-04-22 DIAGNOSIS — R7303 Prediabetes: Secondary | ICD-10-CM | POA: Diagnosis not present

## 2023-04-22 DIAGNOSIS — E782 Mixed hyperlipidemia: Secondary | ICD-10-CM | POA: Diagnosis not present

## 2023-04-22 DIAGNOSIS — R270 Ataxia, unspecified: Secondary | ICD-10-CM | POA: Diagnosis not present

## 2023-04-22 DIAGNOSIS — I10 Essential (primary) hypertension: Secondary | ICD-10-CM | POA: Diagnosis not present

## 2023-04-23 DIAGNOSIS — Z961 Presence of intraocular lens: Secondary | ICD-10-CM | POA: Diagnosis not present

## 2023-04-23 DIAGNOSIS — H35371 Puckering of macula, right eye: Secondary | ICD-10-CM | POA: Diagnosis not present

## 2023-04-23 DIAGNOSIS — H43813 Vitreous degeneration, bilateral: Secondary | ICD-10-CM | POA: Diagnosis not present

## 2023-04-28 ENCOUNTER — Encounter (INDEPENDENT_AMBULATORY_CARE_PROVIDER_SITE_OTHER): Payer: PPO | Admitting: Urology

## 2023-04-28 ENCOUNTER — Encounter: Payer: Self-pay | Admitting: Urology

## 2023-04-28 NOTE — Progress Notes (Signed)
Patient was made a 52-month follow-up appointment when he was not due for routine follow-up for 6 months.  His appointment was rescheduled to February 2025.

## 2023-05-04 DIAGNOSIS — G4731 Primary central sleep apnea: Secondary | ICD-10-CM | POA: Diagnosis not present

## 2023-05-04 DIAGNOSIS — G4733 Obstructive sleep apnea (adult) (pediatric): Secondary | ICD-10-CM | POA: Diagnosis not present

## 2023-05-05 ENCOUNTER — Encounter: Payer: Self-pay | Admitting: Physical Therapy

## 2023-05-05 ENCOUNTER — Ambulatory Visit: Payer: PPO | Attending: Family Medicine | Admitting: Physical Therapy

## 2023-05-05 ENCOUNTER — Other Ambulatory Visit: Payer: Self-pay

## 2023-05-05 DIAGNOSIS — M6281 Muscle weakness (generalized): Secondary | ICD-10-CM

## 2023-05-05 DIAGNOSIS — R262 Difficulty in walking, not elsewhere classified: Secondary | ICD-10-CM

## 2023-05-05 NOTE — Therapy (Unsigned)
OUTPATIENT PHYSICAL THERAPY NEURO EVALUATION   Patient Name: Jon Fischer MRN: [id number] DOB:04/20/1941, 82 y.o., male Today's Date: 05/05/2023   PCP: Lupita Raider, MD  REFERRING PROVIDER: Lupita Raider, MD   END OF SESSION:  PT End of Session - 05/05/23 1433     Visit Number 1    Number of Visits 5    Date for PT Re-Evaluation 06/09/23    PT Start Time 1150    PT Stop Time 1230    PT Time Calculation (min) 40 min             Past Medical History:  Diagnosis Date   Hypercholesteremia    Hypertension    No past surgical history on file. Patient Active Problem List   Diagnosis Date Noted   Erectile dysfunction due to arterial insufficiency 11/20/2020   History of nephrolithiasis 05/16/2020   Elevated PSA 11/17/2019   Benign prostatic hyperplasia with lower urinary tract symptoms 09/02/2015   Benign prostatic hyperplasia with urinary obstruction 02/25/2015    ONSET DATE: 1 year   REFERRING DIAG:  Diagnosis  R27.0 (ICD-10-CM) - Ataxia    THERAPY DIAG:  Muscle weakness (generalized)  Difficulty in walking, not elsewhere classified  Rationale for Evaluation and Treatment: Rehabilitation  SUBJECTIVE:                                                                                                                                                                                             SUBJECTIVE STATEMENT: Pt reports to PT with diagnosis of ataxia. States that he felling like his ankles are sore and week limiting long distance gait.   Pt accompanied by: self  PERTINENT HISTORY: Pt with hx of HTN, Hyperlipidemia, Prediabetes and recent diagnosis of Ataxia per MD note on 10/3. Also noted hx of OA and possible gout.  PAIN:  Are you having pain? Yes: NPRS scale: 4/10 Pain location: ankles L>R    Pain description: throbbing  Aggravating factors: walking  Relieving factors: rest. Hemp oil   PRECAUTIONS: None  RED FLAGS: None   WEIGHT  BEARING RESTRICTIONS: No  FALLS:  Has patient fallen in last 6 months? No  LIVING ENVIRONMENT: Lives with: lives with their spouse Lives in: House/apartment Stairs: Yes: External: 5 steps; on left going up Has following equipment at home: walking stick   OCCUPATION: retired. Work in Nurse, adult. Enjoys working in the yard.   PLOF: Independent and Independent with basic ADLs  PATIENT GOALS: improve BLE strength, L>R   NEXT MD VISIT: roughly 6 months with PCP and neurology   OBJECTIVE:  Note: Objective measures were completed at  Evaluation unless otherwise noted. OBJECTIVE:  Note: Objective measures were completed at Evaluation unless otherwise noted.  DIAGNOSTIC FINDINGS:  No relevant imaging.   COGNITION: Overall cognitive status: Within functional limits for tasks assessed   SENSATION: WFL  COORDINATION: Mild tremor in Bil hands at end range with finger to nose Decreased speed LLE   EDEMA:  None   MUSCLE TONE: WFL    POSTURE: rounded shoulders and forward head  LOWER EXTREMITY ROM:     WFL BLE   LOWER EXTREMITY MMT:    MMT Right Eval Left Eval  Hip flexion 4 4+  Hip extension    Hip abduction 4+ 4+  Hip adduction 5 5  Hip internal rotation    Hip external rotation    Knee flexion 4+ 4+  Knee extension 4 4+  Ankle dorsiflexion 4+ 5  Ankle plantarflexion    Ankle inversion 4+ 4  Ankle eversion 4+ 4+  (Blank rows = not tested)  BED MOBILITY:  Sit to supine Complete Independence Supine to sit Complete Independence  TRANSFERS: Assistive device utilized: None  Sit to stand: Complete Independence Stand to sit: Complete Independence Chair to chair: Complete Independence Floor: TBD  RAMP:  Level of Assistance: Complete Independence Assistive device utilized: None Ramp Comments:   CURB:  Level of Assistance: Modified independence Assistive device utilized:  rail on descent  Curb Comments: mild lob with descent   STAIRS: Level  of Assistance: Modified independence Stair Negotiation Technique: Alternating Pattern  with Single Rail on Right Number of Stairs: 4  Height of Stairs: 6  Comments: mild LOB on initial descent step.   GAIT: Gait pattern:  mild foot slap on the LLE>  and wide BOS Distance walked: 60 Assistive device utilized: None Level of assistance: Complete Independence Comments:   FUNCTIONAL TESTS:  6 minute walk test: to be completed session 2  10 meter walk test: 8 sec  Functional gait assessment: 20/30   PATIENT SURVEYS:  FOTO 60 (goal 64)  TODAY'S TREATMENT:                                                                                                                              DATE: Eval.  HEP provided for improved ankle strengthening.  See below     PATIENT EDUCATION: Education details: POC. Goals. Pt educated throughout session about proper posture and technique with exercises. Improved exercise technique, movement at target joints, use of target muscles after min to mod verbal, visual, tactile cues.  Person educated: Patient Education method: Explanation Education comprehension: verbalized understanding  HOME EXERCISE PROGRAM: Access Code: [email or web address] URL: [email or web address] Date: 05/05/2023 Prepared by: Grier Rocher  Exercises - Seated Ankle Plantarflexion with Resistance  - 1 x daily - 7 x weekly - 3 sets - 15 reps - 2 hold - Seated Ankle Dorsiflexion with Anchored Resistance  - 1 x daily - 7 x weekly - 3 sets - 15 reps - 2 hold -  Standing Single Leg Stance with Unilateral Counter Support  - 1 x daily - 7 x weekly - 3 sets - 4 reps - 10 hold GOALS: Goals reviewed with patient? Yes   SHORT TERM GOALS: Target date: 06/10/2023    Patient will be independent in home exercise program to improve strength/mobility for better functional independence with ADLs. Baseline: given on 10/16 Goal status: INITIAL   LONG TERM GOALS: Target date:  06/10/2023    Patient will increase FOTO score to equal to or greater than  64   to demonstrate statistically significant improvement in mobility and quality of life.  Baseline:  Goal status: INITIAL  2.  Patient (> 82 years old) will increase 6 min walk test by >163ft indicating an increased LE strength and improved balance. Baseline: to be completed  Goal status: INITIAL  3.  Patient will increase FGA score by > 4 points to demonstrate decreased fall risk during functional activities Baseline: 20 Goal status: INITIAL  4.  Patient will increase reported time in standing without pain to > 30 minutes to improve safety and function with ADLs.  Baseline:  Goal status: INITIAL    ASSESSMENT:  CLINICAL IMPRESSION: Patient is a 82 y.o.  male who was seen today for physical therapy evaluation and treatment for balance issues. No overt coordination noted on this eval, but pt does report pain and weakness in Bil ankles, LLE>RLE. Was  noted to have mild balance difficulties noted on FGA of 20/30, pain with walking, and mild strength deficits in ankles. Pt will benefit from skilled PT to address strength, balance and pain deficits to improve overall QoL.   OBJECTIVE IMPAIRMENTS: decreased coordination, decreased mobility, difficulty walking, and decreased strength.   ACTIVITY LIMITATIONS: squatting, stairs, and locomotion level  PARTICIPATION LIMITATIONS: shopping, community activity, and yard work  PERSONAL FACTORS: Age and 1-2 comorbidities: HTN, OA prediabetes.   are also affecting patient's functional outcome.   REHAB POTENTIAL: Good  CLINICAL DECISION MAKING: Stable/uncomplicated  EVALUATION COMPLEXITY: Low  PLAN:  PT FREQUENCY: 1x/week  PT DURATION: 6 weeks  PLANNED INTERVENTIONS: 97110-Therapeutic exercises, 97530- Therapeutic activity, [id number]- Neuromuscular re-education, 97535- Self Care, [id number]- Manual therapy, [id number]- Gait training, [id number]- Aquatic Therapy, [id number]- Electrical  stimulation (unattended), [id number]- Electrical stimulation (manual), [id number]- Ultrasound, [id number]- Ionotophoresis 4mg /ml Dexamethasone, Balance training, Stair training, Dry Needling, Joint mobilization, Joint manipulation, DME instructions, Cryotherapy, and Moist heat  PLAN FOR NEXT SESSION: complete 6 min walk test. Ankle AROM and manual therapy for pain management continue to expand HEP for BLE strengthening.    Golden Pop, PT 05/05/2023, 2:35 PM

## 2023-05-10 ENCOUNTER — Ambulatory Visit: Payer: PPO | Admitting: Physical Therapy

## 2023-05-10 DIAGNOSIS — M6281 Muscle weakness (generalized): Secondary | ICD-10-CM

## 2023-05-10 DIAGNOSIS — R262 Difficulty in walking, not elsewhere classified: Secondary | ICD-10-CM

## 2023-05-10 NOTE — Therapy (Signed)
OUTPATIENT PHYSICAL THERAPY NEURO EVALUATION   Patient Name: Jon Fischer MRN: [id number] DOB:[birth year], 82 y.o., male Today's Date: 05/10/2023   PCP: Lupita Raider, MD  REFERRING PROVIDER: Lupita Raider, MD   END OF SESSION:  PT End of Session - 05/10/23 1448     Visit Number 2    Number of Visits 5    Date for PT Re-Evaluation 06/09/23    PT Start Time 1449    PT Stop Time 1530    PT Time Calculation (min) 41 min             Past Medical History:  Diagnosis Date   Hypercholesteremia    Hypertension    No past surgical history on file. 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 Went to [employer] over the weekend. Was able to completed HEP a few times since last week.   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 States that he felling like his ankles are sore and week limiting long distance gait.   Pt accompanied by: self  PERTINENT HISTORY: Pt with hx of HTN, Hyperlipidemia, Prediabetes and recent diagnosis of Ataxia per MD note on 10/3. Also noted hx of OA and possible gout.  PAIN:  Are you having pain? Yes: NPRS scale: 4/10 Pain location: ankles L>R    Pain description:  throbbing  Aggravating factors: walking  Relieving factors: rest. Hemp oil   PRECAUTIONS: None  RED FLAGS: None   WEIGHT BEARING RESTRICTIONS: No  FALLS:  Has patient fallen in last 6 months? No  LIVING ENVIRONMENT: Lives with: lives with their spouse Lives in: House/apartment Stairs: Yes: External: 5 steps; on left going up Has following equipment at home: walking stick   OCCUPATION: retired. Work in Nurse, adult. Enjoys working in the yard.   PLOF: Independent and Independent with basic ADLs  PATIENT GOALS:  improve BLE strength, L>R   NEXT MD VISIT: roughly 6 months with PCP and neurology   OBJECTIVE:  Note: Objective measures were completed at Evaluation unless otherwise noted. OBJECTIVE:  Note: Objective measures were completed at Evaluation unless otherwise noted.  DIAGNOSTIC FINDINGS:  No relevant imaging.   COGNITION: Overall cognitive status: Within functional limits for tasks assessed   SENSATION: WFL  COORDINATION: Mild tremor in Bil hands at end range with finger to nose Decreased speed LLE   EDEMA:  None   MUSCLE TONE: WFL    POSTURE: rounded shoulders and forward head  LOWER EXTREMITY ROM:     WFL BLE   LOWER EXTREMITY MMT:    MMT Right Eval Left Eval  Hip flexion 4 4+  Hip extension    Hip abduction 4+ 4+  Hip adduction 5 5  Hip internal rotation    Hip external rotation    Knee flexion 4+ 4+  Knee extension 4 4+  Ankle dorsiflexion 4+ 5  Ankle plantarflexion    Ankle inversion 4+ 4  Ankle eversion 4+ 4+  (Blank rows = not tested)  BED MOBILITY:  Sit to supine Complete Independence Supine to sit Complete Independence  TRANSFERS: Assistive device utilized: None  Sit to stand: Complete Independence Stand to sit: Complete Independence Chair to chair: Complete Independence Floor: TBD  RAMP:  Level of Assistance: Complete Independence Assistive device utilized: None Ramp Comments:   CURB:  Level of  Assistance: Modified independence Assistive device utilized:  rail on descent  Curb Comments: mild lob with descent   STAIRS: Level of Assistance: Modified independence Stair Negotiation Technique: Alternating Pattern  with Single Rail on Right Number of Stairs: 4  Height of Stairs: 6  Comments: mild LOB on initial descent step.   GAIT: Gait pattern:  mild foot slap on the LLE>  and wide BOS Distance walked: 60 Assistive device utilized: None Level of assistance: Complete Independence Comments:   FUNCTIONAL TESTS:  6 minute walk test: 1080ft SOB 5/10 with slight pain in the R hip   10 meter walk test: 8 sec  Functional gait assessment: 20/30   PATIENT SURVEYS:  FOTO 60 (goal 64)  TODAY'S TREATMENT:                                                                                                                              DATE: Eval.   6 Min Walk Test:  Instructed patient to ambulate as quickly and as safely as possible for 6 minutes using LRAD. Patient was allowed to take standing rest breaks without stopping the test, but if the patient required a sitting rest break the clock would be stopped and the test would be over.  Results: 1049 feet using no AD with supervision assist, mild R hip upon completion. Results indicate that the patient has reduced endurance with ambulation compared to age matched norms.  Age Matched Norms: 42-69 yo M: 1 F: 538, 87-79  yo M: 38 F: 471, 15-89 yo M: 417 F: 392 MDC: 58.21 meters (190.98 feet) or 50 meters (ANPTA Core Set of Outcome Measures for Adults with Neurologic Conditions, 2018)  VS assessed upon completion  of 6 min WT.  0 min rest: 169/67 HR 79 (SOB 4/10)  2 min rest: 129/60 HR 60   Standing on airex pad:  Eyes open 30 sec/ eyes closed x 10 sec x 3 bouts  Foot tap on 6 inch step 2 x 10 bil with intermittent UE support   Seated ankle DF x 20 bil YTB Seated ankle Eversion x 15 bil YTB  Seated ankle  PF x 15 bil YTB   Standing  SLS 2x 15sec Bil   Noted to have increased difficulty with R side compared to L side     PATIENT EDUCATION: Education details: POC. Goals. Pt educated throughout session about proper posture and technique with exercises. Improved exercise technique, movement at target joints, use of target muscles after min to mod verbal, visual, tactile cues.  Person educated: Patient Education method: Explanation Education comprehension: verbalized understanding  HOME EXERCISE PROGRAM: Access Code: [email or web address] URL: [email or web address] Date: 05/05/2023 Prepared by: Grier Rocher  Exercises - Seated Ankle Plantarflexion with Resistance  - 1 x daily - 7 x weekly - 3 sets - 15 reps - 2 hold - Seated Ankle Dorsiflexion with Anchored Resistance  - 1 x daily - 7 x weekly - 3 sets - 15 reps - 2 hold - Standing Single Leg Stance with Unilateral Counter Support  - 1 x daily - 7 x weekly - 3 sets - 4 reps - 10 hold  GOALS: Goals reviewed with patient? Yes   SHORT TERM GOALS: Target date: 06/10/2023    Patient will be independent in home exercise program to improve strength/mobility for better functional independence with ADLs. Baseline: given on 10/16 Goal status: INITIAL   LONG TERM GOALS: Target date: 06/10/2023    Patient will increase FOTO score to equal to or greater than  64   to demonstrate statistically significant improvement in mobility and quality of life.  Baseline:  Goal status: INITIAL  2.  Patient (> 70 years old) will increase 6 min walk test by >156ft indicating an increased LE strength and improved balance. Baseline: to be completed  Goal status: INITIAL  3.  Patient will increase FGA score by > 4 points to demonstrate decreased fall risk during functional activities Baseline: 20 Goal status: INITIAL  4.  Patient will increase reported time in standing without pain to > 30 minutes to improve safety and function with ADLs.  Baseline:  Goal status:  INITIAL    ASSESSMENT:  CLINICAL IMPRESSION: Patient is a 83 y.o.  male who was seen today for physical therapy  treatment for balance issues. PT instructed pt in BLE strengthening and stability through use of airex pad and resistance band. Pt completed 6 min walk test limited to 1061ft with mild SOB and pa in the R hip. Pt will benefit from skilled PT to address strength, balance and pain deficits to improve overall QoL.   OBJECTIVE IMPAIRMENTS: decreased coordination, decreased mobility, difficulty walking, and decreased strength.   ACTIVITY LIMITATIONS: squatting, stairs, and locomotion level  PARTICIPATION LIMITATIONS: shopping, community activity, and yard work  PERSONAL FACTORS: Age and 1-2 comorbidities: HTN, OA prediabetes.   are also affecting patient's functional outcome.   REHAB POTENTIAL: Good  CLINICAL DECISION MAKING: Stable/uncomplicated  EVALUATION COMPLEXITY: Low  PLAN:  PT FREQUENCY: 1x/week  PT DURATION: 6 weeks  PLANNED INTERVENTIONS: 97110-Therapeutic exercises, 97530- Therapeutic activity, [id number]- Neuromuscular re-education, 97535- Self Care, 52841- Manual therapy, [id number]- Gait training, [phone]- Aquatic Therapy, 97014- Electrical stimulation (unattended), [id number]- Electrical stimulation (manual), [id number]- Ultrasound, 10272- Ionotophoresis 4mg /ml Dexamethasone, Balance training, Stair training, Dry Needling, Joint mobilization, Joint manipulation, DME instructions, Cryotherapy, and Moist heat  PLAN FOR NEXT SESSION:  Balance and ankle strengthening   Golden Pop, PT 05/10/2023, 2:51 PM

## 2023-05-17 ENCOUNTER — Ambulatory Visit: Payer: PPO | Admitting: Physical Therapy

## 2023-05-17 DIAGNOSIS — M6281 Muscle weakness (generalized): Secondary | ICD-10-CM

## 2023-05-17 DIAGNOSIS — R262 Difficulty in walking, not elsewhere classified: Secondary | ICD-10-CM

## 2023-05-17 NOTE — Therapy (Unsigned)
OUTPATIENT PHYSICAL THERAPY NEURO treatment    Patient Name: Jon Fischer MRN: [id number] DOB:Apr 24, 1941, 82 y.o., male Today's Date: 05/17/2023   PCP: Lupita Raider, MD  REFERRING PROVIDER: Lupita Raider, MD   END OF SESSION:  PT End of Session - 05/17/23 1447     Visit Number 3    Number of Visits 5    Date for PT Re-Evaluation 06/09/23    PT Start Time 1448    PT Stop Time 1530    PT Time Calculation (min) 42 min             Past Medical History:  Diagnosis Date   Hypercholesteremia    Hypertension    No past surgical history on file. 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 Had an uneventful weekend. States that he has been moving a refrigerator this morning with wife. Mild ankle pain reported from not wearing ankle braces.   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 States that he felling like his ankles are sore and week limiting long distance gait.   Pt accompanied by: self  PERTINENT HISTORY: Pt with hx of HTN, Hyperlipidemia, Prediabetes and recent diagnosis of Ataxia per MD note on 10/3. Also noted hx of OA and possible gout.  PAIN:  Are you having pain? Yes: NPRS scale:  6/10 Pain location: Bil ankles     Pain description: throbbing  Aggravating factors: walking  Relieving factors: rest. Hemp oil   PRECAUTIONS: None  RED FLAGS: None   WEIGHT BEARING RESTRICTIONS: No  FALLS:  Has patient fallen in last 6 months? No  LIVING ENVIRONMENT: Lives with: lives with their spouse Lives in: House/apartment Stairs: Yes: External: 5 steps; on left going up Has following equipment at home: walking stick   OCCUPATION: retired. Work in Nurse, adult. Enjoys working in the yard.  PLOF: Independent and Independent with basic ADLs  PATIENT GOALS: improve BLE strength, L>R   NEXT MD VISIT: roughly 6 months with PCP and neurology   OBJECTIVE:  Note: Objective measures were completed at Evaluation unless otherwise noted. OBJECTIVE:  Note: Objective measures were completed at Evaluation unless otherwise noted.  DIAGNOSTIC FINDINGS:  No relevant imaging.   COGNITION: Overall cognitive status: Within functional limits for tasks assessed   SENSATION: WFL  COORDINATION: Mild tremor in Bil hands at end range with finger to nose Decreased speed LLE   EDEMA:  None   MUSCLE TONE: WFL    POSTURE: rounded shoulders and forward head  LOWER EXTREMITY ROM:     WFL BLE   LOWER EXTREMITY MMT:    MMT Right Eval Left Eval  Hip flexion 4 4+  Hip extension    Hip abduction 4+ 4+  Hip adduction 5 5  Hip internal rotation    Hip external rotation    Knee flexion 4+ 4+  Knee extension 4 4+  Ankle dorsiflexion 4+ 5  Ankle plantarflexion    Ankle inversion 4+ 4  Ankle eversion 4+ 4+  (Blank rows = not tested)  BED MOBILITY:  Sit to supine Complete Independence Supine to sit Complete Independence  TRANSFERS: Assistive device utilized: None  Sit to stand: Complete Independence Stand to sit: Complete Independence Chair to chair: Complete Independence Floor: TBD  RAMP:  Level of Assistance: Complete Independence Assistive device  utilized: None Ramp Comments:   CURB:  Level of Assistance: Modified independence Assistive device utilized:  rail on descent  Curb Comments: mild lob with descent   STAIRS: Level of Assistance: Modified independence Stair Negotiation Technique: Alternating Pattern  with Single Rail on Right Number of Stairs: 4  Height of Stairs: 6  Comments: mild LOB on initial descent step.   GAIT: Gait pattern:  mild foot slap on the LLE>  and wide BOS Distance walked: 60 Assistive device utilized: None Level of assistance: Complete Independence Comments:   FUNCTIONAL TESTS:  6 minute walk test: 1054ft SOB 5/10 with slight pain in the R hip   10 meter walk test: 8 sec  Functional gait assessment: 20/30   PATIENT SURVEYS:  FOTO 60 (goal 64)  TODAY'S TREATMENT:                                                                                                                              DATE: Eval.   PT performed manual therapy for pain management in bil feet, ankles, and distal LE.  STM to plantar fascia, soleus, Gastroc, and foot/digit flexor muscles with mutiple trigger point release in soleus and proximal gastroc x 10 min each. Ankle DF stretch 2 x 1 min each. Gentle grade 1-2 AP ankle mobs x 40 sec bil   Supine ankle DF x 20 bil YTB Supine ankle  PF x 15 bil YTB  Supine SAQ 3# ankle weight x 12 with 3  sec hold  Bridge x 10 bil  SLR AROMx 12 bil with 2 sec hold  Hip adduction x 12 to squeeze ball   Pt reports decreased pain in Bil ankles upon completion of treatment    PATIENT EDUCATION: Education details: POC. Goals. Pt educated throughout session about proper posture and technique with exercises. Improved exercise technique, movement at target joints, use of target muscles after min to mod verbal, visual, tactile cues.  Person educated: Patient Education method: Explanation Education comprehension: verbalized understanding  HOME EXERCISE PROGRAM: Access Code: [email or web address] URL:  [email or web address] Date: 05/05/2023 Prepared by: Grier Rocher  Exercises - Seated Ankle Plantarflexion with Resistance  - 1 x daily - 7 x weekly - 3 sets - 15 reps - 2 hold - Seated Ankle Dorsiflexion with Anchored Resistance  - 1 x daily - 7 x weekly - 3 sets - 15 reps - 2 hold - Standing Single Leg Stance with Unilateral Counter Support  - 1 x daily - 7 x weekly - 3 sets - 4 reps - 10 hold  GOALS: Goals reviewed with patient? Yes   SHORT TERM GOALS: Target date: 06/10/2023    Patient will be independent in home exercise program to improve strength/mobility for better functional independence with ADLs. Baseline: given on 10/16 Goal status: INITIAL   LONG TERM GOALS: Target date: 06/10/2023    Patient will increase FOTO score to equal to or greater than  64   to demonstrate statistically significant improvement in mobility and quality of life.  Baseline:  Goal status: INITIAL  2.  Patient (> 36 years old) will increase 6 min walk test by >133ft indicating an increased LE strength and improved balance. Baseline: to be completed  Goal status: INITIAL  3.  Patient will increase FGA score by > 4 points to demonstrate decreased fall risk during functional activities Baseline: 20 Goal status: INITIAL  4.  Patient will increase reported time in standing without pain to > 30 minutes to improve safety and function with ADLs.  Baseline:  Goal status: INITIAL    ASSESSMENT:  CLINICAL IMPRESSION: Patient is a 81 y.o.  male who was seen today for physical therapy  treatment for balance issues. Pt reports pain in bil ankles at start of PT treatment. PT treatment focused on improved muscle extensibility and pain moduation with manual therapy and TP release posterior compartment of distal LE. Therex expanding to improve strength in quads and gluteals in supine.  with Pt will benefit from skilled PT to address strength, balance and pain deficits to improve overall QoL.    OBJECTIVE IMPAIRMENTS: decreased coordination, decreased mobility, difficulty walking, and decreased strength.   ACTIVITY LIMITATIONS: squatting, stairs, and locomotion level  PARTICIPATION LIMITATIONS: shopping, community activity, and yard work  PERSONAL FACTORS: Age and 1-2 comorbidities: HTN, OA prediabetes.   are also affecting patient's functional outcome.   REHAB POTENTIAL: Good  CLINICAL DECISION MAKING: Stable/uncomplicated  EVALUATION COMPLEXITY: Low  PLAN:  PT FREQUENCY: 1x/week  PT DURATION: 6 weeks  PLANNED INTERVENTIONS: 97110-Therapeutic exercises, 97530- Therapeutic activity, [id number]- Neuromuscular re-education, 97535- Self Care, 44010- Manual therapy, [id number]- Gait training, [id number]- Aquatic Therapy, 97014- Electrical stimulation (unattended), [id number]- Electrical stimulation (manual), [id number]- Ultrasound, 27253- Ionotophoresis 4mg /ml Dexamethasone, Balance training, Stair training, Dry Needling, Joint mobilization, Joint manipulation, DME instructions, Cryotherapy, and Moist heat  PLAN FOR NEXT SESSION:  Provide Balance HEP and continue ankle and BLE strengthening.    Golden Pop, PT 05/17/2023, 2:48 PM

## 2023-05-24 ENCOUNTER — Ambulatory Visit: Payer: PPO | Admitting: Physical Therapy

## 2023-05-25 ENCOUNTER — Ambulatory Visit: Payer: PPO | Attending: Family Medicine | Admitting: Physical Therapy

## 2023-05-25 DIAGNOSIS — M6281 Muscle weakness (generalized): Secondary | ICD-10-CM | POA: Diagnosis not present

## 2023-05-25 DIAGNOSIS — R262 Difficulty in walking, not elsewhere classified: Secondary | ICD-10-CM | POA: Diagnosis not present

## 2023-05-25 NOTE — Therapy (Signed)
OUTPATIENT PHYSICAL THERAPY NEURO treatment    Patient Name: Jon Fischer MRN: [id number] DOB:[birth year], 82 y.o., male Today's Date: 05/25/2023   PCP: Lupita Raider, MD  REFERRING PROVIDER: Lupita Raider, MD   END OF SESSION:  PT End of Session - 05/25/23 1016     Visit Number 4    Number of Visits 5    Date for PT Re-Evaluation 06/09/23    PT Start Time 1016    PT Stop Time 1058    PT Time Calculation (min) 42 min    Behavior During Therapy [facility] for tasks assessed/performed             Past Medical History:  Diagnosis Date   Hypercholesteremia    Hypertension    No past surgical history on file. 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 Had a good weekend. A little pain in both ankles. Pt rates pain in the ankles 3/10.   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 States that he felling like his ankles are sore and week limiting long distance gait.   Pt accompanied by: self  PERTINENT HISTORY: Pt with hx of HTN, Hyperlipidemia, Prediabetes and recent diagnosis of Ataxia per MD note on 10/3. Also noted hx of OA and possible gout.  PAIN:  Are you having pain? Yes: NPRS scale:  6/10 Pain location: Bil ankles     Pain description: throbbing  Aggravating factors: walking  Relieving factors: rest. Hemp oil   PRECAUTIONS: None  RED FLAGS: None   WEIGHT BEARING RESTRICTIONS: No  FALLS:  Has patient fallen in last 6 months? No  LIVING ENVIRONMENT: Lives with: lives with their spouse Lives in: House/apartment Stairs: Yes: External: 5 steps; on left going up Has following equipment at home: walking stick   OCCUPATION: retired. Work in Nurse, adult. Enjoys working in the yard.  PLOF: Independent and Independent with basic ADLs  PATIENT GOALS: improve BLE strength, L>R   NEXT MD VISIT: roughly 6 months with PCP and neurology   OBJECTIVE:  Note: Objective measures were completed at Evaluation unless otherwise noted. OBJECTIVE:  Note: Objective measures were completed at Evaluation unless otherwise noted.  DIAGNOSTIC FINDINGS:  No relevant imaging.   COGNITION: Overall cognitive status: Within functional limits for tasks assessed   SENSATION: WFL  COORDINATION: Mild tremor in Bil hands at end range with finger to nose Decreased speed LLE   EDEMA:  None   MUSCLE TONE: WFL    POSTURE: rounded shoulders and forward head  LOWER EXTREMITY ROM:     WFL BLE   LOWER EXTREMITY MMT:    MMT Right Eval Left Eval  Hip flexion 4 4+  Hip extension    Hip abduction 4+ 4+  Hip adduction 5 5  Hip internal rotation    Hip external rotation    Knee flexion 4+ 4+  Knee extension 4 4+  Ankle dorsiflexion 4+ 5  Ankle plantarflexion    Ankle inversion 4+ 4  Ankle eversion 4+ 4+  (Blank rows = not tested)  BED MOBILITY:  Sit to supine Complete Independence Supine to sit Complete Independence  TRANSFERS: Assistive device utilized: None  Sit to stand: Complete Independence Stand to sit: Complete Independence Chair to chair: Complete Independence Floor: TBD  RAMP:  Level of Assistance: Complete Independence Assistive device  utilized: None Ramp Comments:   CURB:  Level of Assistance: Modified independence Assistive device utilized:  rail on descent  Curb Comments: mild lob with descent   STAIRS: Level of Assistance: Modified independence Stair Negotiation Technique: Alternating Pattern  with Single Rail on Right Number of Stairs: 4  Height of Stairs: 6  Comments: mild LOB on initial descent step.   GAIT: Gait pattern:  mild foot slap on the LLE>  and wide BOS Distance walked: 60 Assistive device utilized: None Level of assistance: Complete Independence Comments:   FUNCTIONAL TESTS:  6 minute walk test: 1048ft SOB 5/10 with slight pain in the R hip   10 meter walk test: 8 sec  Functional gait assessment: 20/30   PATIENT SURVEYS:  FOTO 60 (goal 64)  TODAY'S TREATMENT:                                                                                                                              DATE:   Standing therex:  At rail:  Manufacturing systems engineer AP rocks x 20 each with BUE support  Rocker board lateral rock x 20 bil  Rocker board AP control  2 x 20 sec  Rocker board lateral control 2 x 20 sec   Tandem with light UE support 2 x 15 sec bil  SLS with BUE support 2 x 10 bil  Narrow BOS 2 x 30 sec   Standing on airex pad Normal BOS 3x 30 sec Eyes closed 2  x 10 sec  Narrow BOS 2 x 15 sec.  Foot tap on 6 inch step x 10 bil  no UE support   Throughout therex, PT provided CGA-supervision assist for safety and improved awareness of LOB in all planes.   PT performed manual therapy Performed 10 min total, STM for bil feet, ankles and calves. Noted trigger points in medial soleus/gastroc and performed trigger point release, grade 2-3 AP ankle mobs x 40 sec. Marland Kitchen  Pt reports mild improvement in pain/stiffness in ankles at end of PT treatment.    PATIENT EDUCATION: Education details: POC. Goals. Pt educated throughout session about proper posture and technique with exercises. Improved exercise technique,  movement at target joints, use of target muscles after min to mod verbal, visual, tactile cues. HEP adjustment.   Person educated: Patient Education method: Explanation Education comprehension: verbalized understanding  HOME EXERCISE PROGRAM: Access Code: [email or web address] URL: [email or web address] Date: 05/25/2023 Prepared by: Grier Rocher  Exercises - Seated Ankle Plantarflexion with Resistance  - 1 x daily - 7 x weekly - 3 sets - 15 reps - 2 hold - Seated Ankle Dorsiflexion with Anchored Resistance  - 1 x daily - 7 x weekly - 3 sets - 15 reps - 2 hold - Standing Single Leg Stance with Unilateral Counter Support  - 1 x daily - 7 x weekly - 3 sets - 4 reps - 10 hold - Standing Tandem Balance with Counter Support  - 1 x daily - 7 x weekly - 3 sets - 10 reps - 15 hold - Feet Together Balance at [employer] Eyes Closed  - 1 x daily - 7 x weekly - 3 sets - 10 reps - 10 hold  GOALS: Goals reviewed with patient? Yes   SHORT TERM GOALS: Target date: 06/10/2023    Patient will be independent in home exercise program to improve strength/mobility for better functional independence with ADLs. Baseline: given on 10/16 Goal status: INITIAL   LONG TERM GOALS: Target date: 06/10/2023    Patient will increase FOTO score to equal to or greater than  64   to demonstrate statistically significant improvement in mobility and quality of life.  Baseline:  Goal status: INITIAL  2.  Patient (> 51 years old) will increase 6 min walk test by >124ft indicating an increased LE strength and improved balance. Baseline: to be completed  Goal status: INITIAL  3.  Patient will increase FGA score by > 4 points to demonstrate decreased fall risk during functional activities Baseline: 20 Goal status: INITIAL  4.  Patient will increase reported time in standing without pain to > 30 minutes to improve safety and function with ADLs.  Baseline:  Goal status: INITIAL    ASSESSMENT:  CLINICAL  IMPRESSION: Patient is a 82 y.o.  male who was seen today for physical therapy  treatment for balance issues. Mild pain in bil ankles, but improved compared to prior session. PT treatment focused on functional balance and BLE strengthening. Mild difficulty with balance tasks on airex pad. Reports pain improved following manual therapy.   with Pt will benefit from skilled PT to address strength, balance and pain deficits to improve overall QoL.   OBJECTIVE IMPAIRMENTS: decreased coordination, decreased mobility, difficulty walking, and decreased strength.   ACTIVITY LIMITATIONS: squatting, stairs, and locomotion level  PARTICIPATION LIMITATIONS: shopping, community activity, and yard work  PERSONAL FACTORS: Age and 1-2 comorbidities: HTN, OA prediabetes.   are also affecting patient's functional outcome.   REHAB POTENTIAL: Good  CLINICAL  DECISION MAKING: Stable/uncomplicated  EVALUATION COMPLEXITY: Low  PLAN:  PT FREQUENCY: 1x/week  PT DURATION: 6 weeks  PLANNED INTERVENTIONS: 97110-Therapeutic exercises, 97530- Therapeutic activity, [id number]- Neuromuscular re-education, 97535- Self Care, 16109- Manual therapy, [id number]- Gait training, [phone]- Aquatic Therapy, 97014- Electrical stimulation (unattended), [id number]- Electrical stimulation (manual), [id number]- Ultrasound, 09811- Ionotophoresis 4mg /ml Dexamethasone, Balance training, Stair training, Dry Needling, Joint mobilization, Joint manipulation, DME instructions, Cryotherapy, and Moist heat  PLAN FOR NEXT SESSION:  Re-assess for additional PT treatment. Expand HEP    Golden Pop, PT 05/25/2023, 10:16 AM

## 2023-05-26 ENCOUNTER — Ambulatory Visit: Payer: PPO | Admitting: Physical Therapy

## 2023-05-31 ENCOUNTER — Ambulatory Visit: Payer: PPO

## 2023-06-02 ENCOUNTER — Ambulatory Visit: Payer: PPO | Admitting: Physical Therapy

## 2023-06-02 DIAGNOSIS — M6281 Muscle weakness (generalized): Secondary | ICD-10-CM | POA: Diagnosis not present

## 2023-06-02 DIAGNOSIS — R262 Difficulty in walking, not elsewhere classified: Secondary | ICD-10-CM

## 2023-06-02 NOTE — Therapy (Signed)
OUTPATIENT PHYSICAL THERAPY NEURO treatment    Patient Name: Jon Fischer MRN: [id number] DOB:[birth year], 82 y.o., male Today's Date: 06/02/2023   PCP: Lupita Raider, MD  REFERRING PROVIDER: Lupita Raider, MD   END OF SESSION:  PT End of Session - 06/02/23 0933     Visit Number 5    Number of Visits 8    Date for PT Re-Evaluation 07/14/23    PT Start Time 0939    PT Stop Time 1018    PT Time Calculation (min) 39 min    Activity Tolerance Patient tolerated treatment well    Behavior During Therapy [facility] for tasks assessed/performed             Past Medical History:  Diagnosis Date   Hypercholesteremia    Hypertension    No past surgical history on file. 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 States tha his ankles are feeling a little better. Is completing HEP at least 4 days a weeks, sometimes more.     Pt accompanied by: self  PERTINENT HISTORY: Pt with hx of HTN, Hyperlipidemia, Prediabetes and recent diagnosis of Ataxia per MD note on 10/3. Also noted hx of OA and possible gout. 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 States that he felling like his ankles are sore and week  limiting long distance gait.   PAIN:  Are you having pain? Yes: NPRS scale: 6/10 Pain location: Bil ankles     Pain description: throbbing  Aggravating factors: walking  Relieving factors: rest. Hemp oil   PRECAUTIONS: None  RED FLAGS: None   WEIGHT BEARING RESTRICTIONS: No  FALLS:  Has patient fallen in last 6 months? No  LIVING ENVIRONMENT: Lives with: lives with their spouse Lives in: House/apartment Stairs: Yes: External: 5 steps; on left going up Has following equipment at home: walking stick  OCCUPATION: retired. Work in Nurse, adult. Enjoys working in the yard.   PLOF: Independent and Independent with basic ADLs  PATIENT GOALS: improve BLE strength, L>R   NEXT MD VISIT: roughly 6 months with PCP and neurology   OBJECTIVE:  Note: Objective measures were completed at Evaluation unless otherwise noted. OBJECTIVE:  Note: Objective measures were completed at Evaluation unless otherwise noted.  DIAGNOSTIC FINDINGS:  No relevant imaging.   COGNITION: Overall cognitive status: Within functional limits for tasks assessed   SENSATION: WFL  COORDINATION: Mild tremor in Bil hands at end range with finger to nose Decreased speed LLE   EDEMA:  None   MUSCLE TONE: WFL    POSTURE: rounded shoulders and forward head  LOWER EXTREMITY ROM:     WFL BLE   LOWER EXTREMITY MMT:    MMT Right Eval Left Eval  Hip flexion 4 4+  Hip extension    Hip abduction 4+ 4+  Hip adduction 5 5  Hip internal rotation    Hip external rotation    Knee flexion 4+ 4+  Knee extension 4 4+  Ankle dorsiflexion 4+ 5  Ankle plantarflexion    Ankle inversion 4+ 4  Ankle eversion 4+ 4+  (Blank rows = not tested)  BED MOBILITY:  Sit to supine Complete Independence Supine to sit Complete Independence  TRANSFERS: Assistive device utilized: None  Sit to stand: Complete Independence Stand to sit: Complete Independence Chair to chair: Complete Independence Floor:  TBD  RAMP:  Level of Assistance: Complete Independence Assistive device utilized: None Ramp Comments:   CURB:  Level of Assistance: Modified independence Assistive device utilized:  rail on descent  Curb Comments: mild lob with descent   STAIRS: Level of Assistance: Modified independence Stair Negotiation Technique: Alternating Pattern  with Single Rail on Right Number of Stairs: 4  Height of Stairs: 6  Comments: mild LOB on initial descent step.   GAIT: Gait pattern:  mild foot slap on the LLE>  and wide BOS Distance walked: 60 Assistive device utilized: None Level of assistance: Complete Independence Comments:   FUNCTIONAL TESTS:  See below   PATIENT SURVEYS:  FOTO 60 (goal 64)  TODAY'S TREATMENT:                                                                                                                              DATE:   PT instructed pt in goal assessment to measure progress towards LTG.   Patient demonstrates increased fall risk as noted by score of 26/30 on  Functional Gait Assessment.   <22/30 = predictive of falls, <20/30 = fall in 6 months, <18/30 = predictive of falls in PD MCID: 5 points stroke population, 4 points geriatric population (ANPTA Core Set of Outcome Measures for Adults with Neurologic Conditions, 2018)  6 Min Walk Test:  Instructed patient to ambulate as quickly and as safely as possible for 6 minutes using LRAD. Patient was allowed to take  standing rest breaks without stopping the test, but if the patient required a sitting rest break the clock would be stopped and the test would be over.  Results: 1256 feet using no AD without assist. No SOB and only mild R ankle soreness. . Results indicate that the patient has reduced endurance with ambulation compared to age matched norms.  Age Matched Norms: 43-69 yo M: 22 F: 60, 68-79 yo M: 4 F: 471, 49-89 yo M: 417 F: 392 MDC: 58.21 meters (190.98 feet) or 50 meters (ANPTA Core Set of Outcome  Measures for Adults with Neurologic Conditions, 2018)  Supine therex.  Ankle inversion x 20 YTB  Ankle eversion x 15 YTB  Ankle DF x 15 YTB.  Bridge x 8  Hip abduction with manual resistance and hips bent to 45 deg x12  PT performed manual therapy Performed 8 min total, STM for bil feet, ankles and calves. Noted trigger points in latera soleus/gastroc and performed trigger point release, grade 2-3 AP ankle mobs x 40 sec. Marland Kitchen  Pt reports mild improvement in pain/stiffness in ankles at end of PT treatment.    PATIENT EDUCATION: Education details: POC. Goals. Pt educated throughout session about proper posture and technique with exercises. Improved exercise technique, movement at target joints, use of target muscles after min to mod verbal, visual, tactile cues. HEP adjustment.   Person educated: Patient Education method: Explanation Education comprehension: verbalized understanding  HOME EXERCISE PROGRAM: Access Code: [email or web address] URL: [email or web address] Date: 05/25/2023 Prepared by: Grier Rocher  Exercises - Seated Ankle Plantarflexion with Resistance  - 1 x daily - 7 x weekly - 3 sets - 15 reps - 2 hold - Seated Ankle Dorsiflexion with Anchored Resistance  - 1 x daily - 7 x weekly - 3 sets - 15 reps - 2 hold - Standing Single Leg Stance with Unilateral Counter Support  - 1 x daily - 7 x weekly - 3 sets - 4 reps - 10 hold - Standing Tandem Balance with Counter Support  - 1 x daily - 7 x weekly - 3 sets - 10 reps - 15 hold - Feet Together Balance at [employer] Eyes Closed  - 1 x daily - 7 x weekly - 3 sets - 10 reps - 10 hold  GOALS: Goals reviewed with patient? Yes   SHORT TERM GOALS: Target date: 06/10/2023    Patient will be independent in home exercise program to improve strength/mobility for better functional independence with ADLs. Baseline: given on 10/16 Goal status: INITIAL   LONG TERM GOALS: Target date: 06/10/2023    Patient will increase FOTO  score to equal to or greater than  64   to demonstrate statistically significant improvement in mobility and quality of life.  Baseline: 60 11/13: 62 Goal status: IN PROGRESS  2.  Patient (> 82 years old) will increase 6 min walk test by >139ft indicating an increased LE strength and improved balance. Baseline:1049 with mild SOB  11/13: 1256 with no SOB, mild soreness in the L ankle, no pain in the R ankle  Goal status: INITIAL  3.  Patient will increase FGA score by > 4 points to demonstrate decreased fall risk during functional activities Baseline: 20 11/13: 26 Goal status: INITIAL  4.  Patient will increase reported time in standing without pain to > 30 minutes to improve safety and function with ADLs.  Baseline: 10-15 minutes  11/13: 15-20 minutes  Goal status: INITIAL    ASSESSMENT:  CLINICAL IMPRESSION: Patient is a  82 y.o.  male who was seen today for physical therapy  treatment for balance issues. PT instructed pt in goal assessment for re-certificaiton. Pt demonstrates improved balance and tolerance to standing with increased distance on 6 min walk test, and improved score on FGA to 26/30. Pt still reports soreness in Bil ankles after 20 min of standing on feet and only mild improvement in self reported function.  Pt will benefit from skilled PT to address strength, balance and pain deficits to improve overall QoL. Patient's condition has the potential to improve in response to therapy. Maximum improvement is yet to be obtained. The anticipated improvement is attainable and reasonable in a generally predictable time.   OBJECTIVE IMPAIRMENTS: decreased coordination, decreased mobility, difficulty walking, and decreased strength.   ACTIVITY LIMITATIONS: squatting, stairs, and locomotion level  PARTICIPATION LIMITATIONS: shopping, community activity, and yard work  PERSONAL FACTORS: Age and 1-2 comorbidities: HTN, OA prediabetes.   are also affecting patient's functional  outcome.   REHAB POTENTIAL: Good  CLINICAL DECISION MAKING: Stable/uncomplicated  EVALUATION COMPLEXITY: Low  PLAN:  PT FREQUENCY: 1x/week  PT DURATION: 6 weeks  PLANNED INTERVENTIONS: 97110-Therapeutic exercises, 97530- Therapeutic activity, [id number]- Neuromuscular re-education, 97535- Self Care, 95621- Manual therapy, [id number]- Gait training, [id number]- Aquatic Therapy, 97014- Electrical stimulation (unattended), [id number]- Electrical stimulation (manual), [id number]- Ultrasound, 30865- Ionotophoresis 4mg /ml Dexamethasone, Balance training, Stair training, Dry Needling, Joint mobilization, Joint manipulation, DME instructions, Cryotherapy, and Moist heat  PLAN FOR NEXT SESSION:  Adjust HEP.  BLE strength and balance training.   Golden Pop, PT 06/02/2023, 11:34 AM

## 2023-06-03 ENCOUNTER — Institutional Professional Consult (permissible substitution): Payer: PPO | Admitting: Internal Medicine

## 2023-06-07 ENCOUNTER — Ambulatory Visit: Payer: PPO | Admitting: Physical Therapy

## 2023-06-07 ENCOUNTER — Telehealth: Payer: Self-pay | Admitting: Physical Therapy

## 2023-06-07 NOTE — Telephone Encounter (Signed)
Called pt to notify pt of missed PT treatment. Pt reports that he forgot of scheduled treatment session. Appointment moved to 9:30 on Wednesday 11/20.   Grier Rocher PT, DPT  Physical Therapist - [facility]  11:31 AM 06/07/23

## 2023-06-09 ENCOUNTER — Ambulatory Visit: Payer: PPO | Admitting: Physical Therapy

## 2023-06-09 ENCOUNTER — Encounter: Payer: Self-pay | Admitting: Podiatry

## 2023-06-09 ENCOUNTER — Ambulatory Visit: Payer: PPO | Admitting: Podiatry

## 2023-06-09 DIAGNOSIS — B351 Tinea unguium: Secondary | ICD-10-CM | POA: Diagnosis not present

## 2023-06-09 DIAGNOSIS — M79676 Pain in unspecified toe(s): Secondary | ICD-10-CM | POA: Diagnosis not present

## 2023-06-09 DIAGNOSIS — R262 Difficulty in walking, not elsewhere classified: Secondary | ICD-10-CM

## 2023-06-09 DIAGNOSIS — M6281 Muscle weakness (generalized): Secondary | ICD-10-CM

## 2023-06-09 MED ORDER — TERBINAFINE HCL 250 MG PO TABS: 250.0000 mg | ORAL_TABLET | Freq: Every day | ORAL | 0 refills | Status: DC

## 2023-06-09 NOTE — Therapy (Signed)
OUTPATIENT PHYSICAL THERAPY NEURO treatment    Patient Name: Jon Fischer MRN: [id number] DOB:1941-04-05, 82 y.o., male Today's Date: 06/09/2023   PCP: Lupita Raider, MD  REFERRING PROVIDER: Lupita Raider, MD   END OF SESSION:  PT End of Session - 06/09/23 0946     Visit Number 6    Number of Visits 8    Date for PT Re-Evaluation 07/14/23    PT Start Time 0933    PT Stop Time 1015    PT Time Calculation (min) 42 min    Activity Tolerance Patient tolerated treatment well    Behavior During Therapy [facility] for tasks assessed/performed             Past Medical History:  Diagnosis Date   Hypercholesteremia    Hypertension    No past surgical history on file. 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 Reports that he has had no pain in feet or ankles for a few weeks. Only light tightness noted in Bil calves, L>R, but improved since starting PT.     Pt accompanied by: self  PERTINENT HISTORY: Pt with hx of HTN, Hyperlipidemia, Prediabetes and recent diagnosis of Ataxia per MD note on 10/3. Also noted hx of OA and possible gout. 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 States that he felling  like his ankles are sore and week limiting long distance gait.   PAIN:  Are you having pain? Yes: NPRS scale: 6/10 Pain location: Bil ankles     Pain description: throbbing  Aggravating factors: walking  Relieving factors: rest. Hemp oil   PRECAUTIONS: None  RED FLAGS: None   WEIGHT BEARING RESTRICTIONS: No  FALLS:  Has patient fallen in last 6 months? No  LIVING ENVIRONMENT: Lives with: lives with their spouse Lives in: House/apartment Stairs: Yes: External: 5 steps; on left going  up Has following equipment at home: walking stick   OCCUPATION: retired. Work in Nurse, adult. Enjoys working in the yard.   PLOF: Independent and Independent with basic ADLs  PATIENT GOALS: improve BLE strength, L>R   NEXT MD VISIT: roughly 6 months with PCP and neurology   OBJECTIVE:  Note: Objective measures were completed at Evaluation unless otherwise noted. OBJECTIVE:  Note: Objective measures were completed at Evaluation unless otherwise noted.  DIAGNOSTIC FINDINGS:  No relevant imaging.   COGNITION: Overall cognitive status: Within functional limits for tasks assessed   SENSATION: WFL  COORDINATION: Mild tremor in Bil hands at end range with finger to nose Decreased speed LLE   EDEMA:  None   MUSCLE TONE: WFL    POSTURE: rounded shoulders and forward head  LOWER EXTREMITY ROM:     WFL BLE   LOWER EXTREMITY MMT:    MMT Right Eval Left Eval  Hip flexion 4 4+  Hip extension    Hip abduction 4+ 4+  Hip adduction 5 5  Hip internal rotation    Hip external rotation    Knee flexion 4+ 4+  Knee extension 4 4+  Ankle dorsiflexion 4+ 5  Ankle plantarflexion    Ankle inversion 4+ 4  Ankle eversion 4+ 4+  (Blank rows = not tested)  BED MOBILITY:  Sit to supine Complete Independence Supine to sit Complete Independence  TRANSFERS: Assistive device utilized: None  Sit to stand: Complete Independence Stand to sit: Complete Independence Chair to  chair: Complete Independence Floor: TBD  RAMP:  Level of Assistance: Complete Independence Assistive device utilized: None Ramp Comments:   CURB:  Level of Assistance: Modified independence Assistive device utilized:  rail on descent  Curb Comments: mild lob with descent   STAIRS: Level of Assistance: Modified independence Stair Negotiation Technique: Alternating Pattern  with Single Rail on Right Number of Stairs: 4  Height of Stairs: 6  Comments: mild LOB on initial descent step.   GAIT: Gait pattern:  mild foot slap on the LLE>  and wide BOS Distance walked: 60 Assistive device utilized: None Level of assistance: Complete Independence Comments:   FUNCTIONAL TESTS:  See below   PATIENT SURVEYS:  FOTO 60 (goal 64)  TODAY'S TREATMENT:                                                                                                                              DATE:   PT treatment focused on improved balance strategies.    Rocker board. AP rocks 2 x 20  AP control 2 x 30 sec without UE support    Standing on  airex pad:  Foot tap on a blaze pods on 6 inch step. Random, 3 pods, 45 sec per foot, x 2 bil.  Focus with 3 pods on step, 3 pods on white board 2 x 1 min with closest extremity.  Pt noted to intermittently support on rail on wall, despite cues  to reduce UE support   Obstacle course navigation step over 3 bolsters, weave through 4 cones, up/down 4 inch aerobic step. Performed 3 laps CW and 3 laps CWW. Noted to have improved step length with increased repetitions.   Manual STM to bil gastroc/soleus and peroneals with TP release. X 9 min total. Pt reports decreased stiffness upon completion.   PATIENT EDUCATION: Education details: POC. Goals. Pt educated throughout session about proper posture and technique with exercises. Improved exercise technique, movement at target joints, use of target muscles after min to mod verbal, visual, tactile cues. HEP adjustment.    Person educated: Patient Education method: Explanation Education comprehension: verbalized understanding  HOME EXERCISE PROGRAM: Access Code: [email or web address] URL: [email or web address] Date: 05/25/2023 Prepared by: Grier Rocher  Exercises - Seated Ankle Plantarflexion with Resistance  - 1 x daily - 7 x weekly - 3 sets - 15 reps - 2 hold - Seated Ankle Dorsiflexion with Anchored Resistance  - 1 x daily - 7 x weekly - 3 sets - 15 reps - 2 hold - Standing Single Leg Stance with Unilateral Counter Support  - 1 x daily - 7 x weekly - 3 sets - 4 reps - 10 hold - Standing Tandem Balance with Counter Support  - 1 x daily - 7 x weekly - 3 sets - 10 reps - 15 hold - Feet Together Balance at [employer] Eyes Closed  - 1 x daily - 7 x weekly - 3 sets - 10 reps - 10 hold  GOALS: Goals reviewed with patient? Yes   SHORT TERM GOALS: Target date: 06/10/2023    Patient will be independent in home exercise program to improve strength/mobility for better functional independence with ADLs. Baseline: given on 10/16 Goal status: INITIAL   LONG TERM GOALS: Target date: 06/10/2023    Patient will increase FOTO score to equal to or greater than  64   to demonstrate statistically significant improvement in mobility and quality of life.  Baseline: 60 11/13: 62 Goal status: IN PROGRESS  2.  Patient (> 12 years old) will increase 6 min walk test by >147ft indicating an increased LE strength and improved balance. Baseline:1049 with mild SOB  11/13: 1256 with no SOB, mild soreness in the L ankle, no pain in the R ankle  Goal status: INITIAL  3.  Patient will increase FGA score by > 4 points to demonstrate decreased fall risk during functional activities Baseline: 20 11/13: 26 Goal status: INITIAL  4.  Patient will increase reported time in standing without pain to > 30 minutes to improve safety and function with ADLs.  Baseline: 10-15 minutes  11/13: 15-20 minutes  Goal status:  INITIAL    ASSESSMENT:  CLINICAL IMPRESSION: Patient is a 82 y.o.  male who was seen today for physical therapy  treatment for balance issues. PT treatment focused on improved dynamic balance. Noted to have improved use of ankle strategy with increased repetitions. Mild stiffness still present in Bil lower limb, improved with STM from PT.  Pt will benefit from skilled PT to address strength, balance and pain deficits to improve overall QoL.   OBJECTIVE IMPAIRMENTS: decreased coordination, decreased mobility, difficulty walking, and decreased strength.   ACTIVITY LIMITATIONS: squatting, stairs, and locomotion level  PARTICIPATION LIMITATIONS: shopping, community activity, and yard work  PERSONAL FACTORS: Age and 1-2 comorbidities: HTN, OA prediabetes.   are also affecting patient's functional outcome.   REHAB POTENTIAL: Good  CLINICAL DECISION MAKING: Stable/uncomplicated  EVALUATION COMPLEXITY: Low  PLAN:  PT FREQUENCY: 1x/week  PT DURATION: 6 weeks  PLANNED INTERVENTIONS: 97110-Therapeutic exercises, 97530- Therapeutic activity, [id number]- Neuromuscular re-education, 97535- Self Care, 16109- Manual therapy, [id number]- Gait training, [phone]- Aquatic Therapy, 97014- Electrical stimulation (unattended), [id number]- Electrical stimulation (manual), [id number]- Ultrasound, 09811- Ionotophoresis 4mg /ml Dexamethasone, Balance training, Stair training, Dry Needling, Joint mobilization, Joint manipulation, DME instructions, Cryotherapy, and Moist heat  PLAN FOR NEXT SESSION:  Adjust HEP.  BLE strength and balance training.  Possible D/C.   Golden Pop, PT 06/09/2023, 9:49 AM

## 2023-06-09 NOTE — Progress Notes (Signed)
Subjective:  Patient ID: Jon Fischer, male    DOB: [birth year],  MRN: [id number] HPI Chief Complaint  Patient presents with   Toe Pain    Hallux left - both borders, ingrown    Nail Problem    Hallux right - "this nail still has fungus, he treated it the last time I was here"   New Patient (Initial Visit)    Est pt 2020    82 y.o. male presents with the above complaint.   ROS: Denies fever chills nausea by muscle aches and pains.  Past Medical History:  Diagnosis Date   Hypercholesteremia    Hypertension    No past surgical history on file.  Current Outpatient Medications:    terbinafine (LAMISIL) 250 MG tablet, Take 1 tablet (250 mg total) by mouth daily., Disp: 30 tablet, Rfl: 0   allopurinol (ZYLOPRIM) 100 MG tablet, Take by mouth., Disp: , Rfl:    Ascorbic Acid (VITAMIN C) 1000 MG tablet, Take 1,000 mg by mouth., Disp: , Rfl:    aspirin EC 81 MG tablet, Take by mouth., Disp: , Rfl:    docusate sodium (STOOL SOFTENER) 100 MG capsule, Take 100 mg by mouth., Disp: , Rfl:    finasteride (PROSCAR) 5 MG tablet, Take 1 tablet (5 mg total) by mouth daily., Disp: 90 tablet, Rfl: 3   lisinopril (PRINIVIL,ZESTRIL) 20 MG tablet, , Disp: , Rfl:    Menthol, Topical Analgesic, (MINERAL ICE) 2 % GEL, , Disp: , Rfl:    sildenafil (VIAGRA) 100 MG tablet, Take 1 tablet (100 mg total) by mouth as needed for erectile dysfunction., Disp: 30 tablet, Rfl: 6   simvastatin (ZOCOR) 10 MG tablet, Take by mouth., Disp: , Rfl:    tamsulosin (FLOMAX) 0.4 MG CAPS capsule, Take 1 capsule (0.4 mg total) by mouth 2 (two) times daily., Disp: 180 capsule, Rfl: 3   verapamil (VERELAN PM) 120 MG 24 hr capsule, Take by mouth., Disp: , Rfl:    vitamin E 400 UNIT capsule, Take by mouth., Disp: , Rfl:   No Known Allergies Review of Systems Objective:  There were no vitals filed for this visit.  General: Well developed, nourished, in no acute distress, alert and oriented x3   Dermatological: Skin is  warm, dry and supple bilateral. Nails x 10 are well maintained; remaining integument appears unremarkable at this time. There are no open sores, no preulcerative lesions, no rash or signs of infection present.  Hallux left demonstrates onychocryptosis with sharp incurvated nail margins there is no erythema edema cellulitis drainage or odor.  Hallux right still demonstrates nail dystrophy and onychomycosis with no other cutaneous signs of fungal infection.  Vascular: Dorsalis Pedis artery and Posterior Tibial artery pedal pulses are 2/4 bilateral with immedate capillary fill time. Pedal hair growth present. No varicosities and no lower extremity edema present bilateral.   Neruologic: Grossly intact via light touch bilateral. Vibratory intact via tuning fork bilateral. Protective threshold with Semmes Wienstein monofilament intact to all pedal sites bilateral. Patellar and Achilles deep tendon reflexes 2+ bilateral. No Babinski or clonus noted bilateral.   Musculoskeletal: No gross boney pedal deformities bilateral. No pain, crepitus, or limitation noted with foot and ankle range of motion bilateral. Muscular strength 5/5 in all groups tested bilateral.  Gait: Unassisted, Nonantalgic.    Radiographs:  None taken  Assessment & Plan:   Assessment: Pain in limb secondary to nail deformity and onychomycosis.  Plan: Started him back on Lamisil 250 mg tablets 1 tablet  daily.  He will follow-up with me in 1 month for blood work.  I also debrided his nails 1 through 5 bilateral for him.     Casten Floren T. [place], [place]

## 2023-06-10 ENCOUNTER — Ambulatory Visit: Payer: PPO

## 2023-06-16 ENCOUNTER — Ambulatory Visit: Payer: PPO | Admitting: Physical Therapy

## 2023-06-21 ENCOUNTER — Other Ambulatory Visit: Payer: Self-pay

## 2023-06-21 ENCOUNTER — Ambulatory Visit: Payer: PPO

## 2023-06-21 DIAGNOSIS — R972 Elevated prostate specific antigen [PSA]: Secondary | ICD-10-CM

## 2023-06-21 DIAGNOSIS — N401 Enlarged prostate with lower urinary tract symptoms: Secondary | ICD-10-CM

## 2023-06-21 MED ORDER — FINASTERIDE 5 MG PO TABS: 5.0000 mg | ORAL_TABLET | Freq: Every day | ORAL | 3 refills | Status: DC

## 2023-06-21 MED ORDER — TAMSULOSIN HCL 0.4 MG PO CAPS: 0.4000 mg | ORAL_CAPSULE | Freq: Two times a day (BID) | ORAL | 3 refills | Status: DC

## 2023-06-21 NOTE — Telephone Encounter (Signed)
Patient called requesting refills for Finasteride and Tamsulosin, this has been refilled and patient advised.

## 2023-06-23 ENCOUNTER — Ambulatory Visit: Payer: PPO | Attending: Family Medicine | Admitting: Physical Therapy

## 2023-06-23 ENCOUNTER — Ambulatory Visit: Payer: PPO | Admitting: Physical Therapy

## 2023-06-23 DIAGNOSIS — M6281 Muscle weakness (generalized): Secondary | ICD-10-CM | POA: Diagnosis not present

## 2023-06-23 DIAGNOSIS — R262 Difficulty in walking, not elsewhere classified: Secondary | ICD-10-CM | POA: Insufficient documentation

## 2023-06-23 NOTE — Therapy (Unsigned)
OUTPATIENT PHYSICAL THERAPY NEURO treatment    Patient Name: Jon Fischer MRN: [id number] DOB:10/05/1940, 81 y.o., male Today's Date: 06/23/2023   PCP: Lupita Raider, MD  REFERRING PROVIDER: Lupita Raider, MD   END OF SESSION:  PT End of Session - 06/23/23 1439     Visit Number 7    Number of Visits 8    Date for PT Re-Evaluation 07/14/23    PT Start Time 1446    PT Stop Time 1530    PT Time Calculation (min) 44 min    Activity Tolerance Patient tolerated treatment well    Behavior During Therapy [facility] for tasks assessed/performed             Past Medical History:  Diagnosis Date   Hypercholesteremia    Hypertension    No past surgical history on file. 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 Was able to see family in [place] over the Thanks giving holiday. Reports "very little" pain at start of PT treatment.    Pt accompanied by: self  PERTINENT HISTORY: Pt with hx of HTN, Hyperlipidemia, Prediabetes and recent diagnosis of Ataxia per MD note on 10/3. Also noted hx of OA and possible gout. Pt  reports to PT with diagnosis of ataxia. States that he felling like his ankles are sore and week limiting long distance gait.   PAIN:  Are you having pain? Yes: NPRS scale: 6/10 Pain location: Bil ankles     Pain description: throbbing  Aggravating factors: walking  Relieving factors: rest. Hemp oil   PRECAUTIONS: None  RED FLAGS: None   WEIGHT BEARING RESTRICTIONS: No  FALLS:  Has patient fallen in last 6 months? No  LIVING ENVIRONMENT: Lives with: lives with their spouse Lives in: House/apartment Stairs:  Yes: External: 5 steps; on left going up Has following equipment at home: walking stick   OCCUPATION: retired. Work in Nurse, adult. Enjoys working in the yard.   PLOF: Independent and Independent with basic ADLs  PATIENT GOALS: improve BLE strength, L>R   NEXT MD VISIT: roughly 6 months with PCP and neurology   OBJECTIVE:  Note: Objective measures were completed at Evaluation unless otherwise noted. OBJECTIVE:  Note: Objective measures were completed at Evaluation unless otherwise noted.  DIAGNOSTIC FINDINGS:  No relevant imaging.   COGNITION: Overall cognitive status: Within functional limits for tasks assessed   SENSATION: WFL  COORDINATION: Mild tremor in Bil hands at end range with finger to nose Decreased speed LLE   EDEMA:  None   MUSCLE TONE: WFL    POSTURE: rounded shoulders and forward head  LOWER EXTREMITY ROM:     WFL BLE   LOWER EXTREMITY MMT:    MMT Right Eval Left Eval  Hip flexion 4 4+  Hip extension    Hip abduction 4+ 4+  Hip adduction 5 5  Hip internal rotation    Hip external rotation    Knee flexion 4+ 4+  Knee extension 4 4+  Ankle dorsiflexion 4+ 5  Ankle plantarflexion    Ankle inversion 4+ 4  Ankle eversion 4+ 4+  (Blank rows = not tested)  BED MOBILITY:  Sit to supine Complete Independence Supine to sit Complete Independence  TRANSFERS: Assistive device utilized: None  Sit to stand: Complete  Independence Stand to sit: Complete Independence Chair to chair: Complete Independence Floor: TBD  RAMP:  Level of Assistance: Complete Independence Assistive device utilized: None Ramp Comments:   CURB:  Level of Assistance: Modified independence Assistive device utilized:  rail on descent  Curb Comments: mild lob with descent   STAIRS: Level of Assistance: Modified independence Stair Negotiation Technique: Alternating Pattern  with Single Rail on Right Number of Stairs: 4  Height of Stairs: 6  Comments: mild LOB on initial descent step.   GAIT: Gait pattern:  mild foot slap on the LLE>  and wide BOS Distance walked: 60 Assistive device utilized: None Level of assistance: Complete Independence Comments:   FUNCTIONAL TESTS:  See below   PATIENT SURVEYS:  FOTO 60 (goal 64)  TODAY'S TREATMENT:                                                                                                                              DATE:    Seated ankle AROM:  Rocker board. AP x 12 with 3 sec hold Lateral rock x 12 bil with 3 sec hold  Dynadisc ankle rolls x 12 bil  Seated hip abduction GTB x 12  Seated LAQ x 12 bil GTB Isometric hip adduction x 12 bil   Knee ankle fleixon on 2nd step x 12 bil  Standing squat x 12 with UE support  Lateral lunge at rail x 10 bil  Ankle PF/DF  x 15 with UE support       PATIENT EDUCATION: Education details: POC. Goals. Pt educated throughout session about proper posture and technique with exercises. Improved exercise technique, movement at target joints, use of target muscles after min to mod verbal, visual, tactile cues. HEP adjustment.   Person educated: Patient Education method: Explanation Education comprehension: verbalized understanding  HOME EXERCISE PROGRAM: Access Code: [email or web address] URL: [email or web address] Date: 05/25/2023 Prepared by: Grier Rocher  Exercises - Seated Ankle Plantarflexion with Resistance  - 1 x  daily - 7 x weekly - 3 sets - 15 reps - 2 hold - Seated Ankle Dorsiflexion with Anchored Resistance  - 1 x daily - 7 x weekly - 3 sets - 15 reps - 2 hold - Standing Single Leg Stance with Unilateral Counter Support  - 1 x daily - 7 x weekly - 3 sets - 4 reps - 10 hold - Standing Tandem Balance with Counter Support  - 1 x daily - 7 x weekly - 3 sets - 10 reps - 15 hold - Feet Together Balance at [employer] Eyes Closed  - 1 x daily - 7 x weekly - 3 sets - 10 reps - 10 hold  GOALS: Goals reviewed with patient? Yes   SHORT TERM GOALS: Target date: 06/10/2023    Patient will be independent in home exercise program to improve strength/mobility for better functional independence with ADLs. Baseline: given on 10/16 Goal status: INITIAL   LONG TERM GOALS: Target date: 06/10/2023    Patient will increase FOTO score to equal to or greater than  64   to demonstrate statistically significant improvement in mobility and quality of life.  Baseline: 60 11/13: 62 Goal status: IN PROGRESS  2.  Patient (> 52 years old) will increase 6 min walk test by >1106ft indicating an increased LE strength and improved balance. Baseline:1049 with mild SOB  11/13: 1256 with no SOB, mild soreness in the L ankle, no pain in the R ankle  Goal status: INITIAL  3.  Patient will increase FGA score by > 4 points to demonstrate decreased fall risk during functional activities Baseline: 20 11/13: 26 Goal status: INITIAL  4.  Patient will increase reported time in standing without pain to > 30 minutes to improve safety and function with ADLs.  Baseline: 10-15 minutes  11/13: 15-20 minutes  Goal status: INITIAL    ASSESSMENT:  CLINICAL IMPRESSION: Patient is a 82 y.o.  male who was seen today for physical therapy  treatment for balance issues. PT treatment focused on improved dynamic balance. Noted to have improved use of ankle strategy with increased repetitions. Mild stiffness still present in Bil lower limb,  improved with STM from PT.  Pt will benefit from skilled PT to address strength, balance and pain deficits to improve overall QoL.   OBJECTIVE IMPAIRMENTS: decreased coordination, decreased mobility, difficulty walking, and decreased strength.   ACTIVITY LIMITATIONS: squatting, stairs, and locomotion level  PARTICIPATION LIMITATIONS: shopping, community activity, and yard work  PERSONAL FACTORS: Age and 1-2 comorbidities: HTN, OA prediabetes.   are also affecting patient's functional outcome.   REHAB POTENTIAL: Good  CLINICAL DECISION MAKING: Stable/uncomplicated  EVALUATION COMPLEXITY: Low  PLAN:  PT FREQUENCY: 1x/week  PT DURATION: 6 weeks  PLANNED INTERVENTIONS: 97110-Therapeutic exercises, 97530- Therapeutic activity, [id number]- Neuromuscular re-education, 97535- Self Care, 54098- Manual therapy, [id number]- Gait training, [id number]- Aquatic Therapy, 97014- Electrical stimulation (unattended), [id number]- Electrical stimulation (manual), [id number]- Ultrasound, 11914- Ionotophoresis 4mg /ml Dexamethasone, Balance training, Stair training, Dry Needling,  Joint mobilization, Joint manipulation, DME instructions, Cryotherapy, and Moist heat  PLAN FOR NEXT SESSION:  Adjust HEP.  BLE strength and balance training.  Possible D/C.   Golden Pop, PT 06/23/2023, 2:51 PM

## 2023-06-28 ENCOUNTER — Ambulatory Visit: Payer: PPO | Admitting: Physical Therapy

## 2023-06-28 ENCOUNTER — Ambulatory Visit: Payer: PPO

## 2023-06-28 DIAGNOSIS — R262 Difficulty in walking, not elsewhere classified: Secondary | ICD-10-CM

## 2023-06-28 DIAGNOSIS — M6281 Muscle weakness (generalized): Secondary | ICD-10-CM

## 2023-06-28 NOTE — Therapy (Signed)
OUTPATIENT PHYSICAL THERAPY NEURO treatment/    Patient Name: Jon Fischer MRN: [id number] DOB:1940-09-26, 82 y.o., male Today's Date: 06/28/2023   PCP: Lupita Raider, MD  REFERRING PROVIDER: Lupita Raider, MD   END OF SESSION:  PT End of Session - 06/28/23 1359     Visit Number 8    Number of Visits 8    Date for PT Re-Evaluation 07/14/23    PT Start Time 1405    PT Stop Time 1445    PT Time Calculation (min) 40 min    Activity Tolerance Patient tolerated treatment well    Behavior During Therapy [facility] for tasks assessed/performed             Past Medical History:  Diagnosis Date   Hypercholesteremia    Hypertension    No past surgical history on file. 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 No pain at start of PT treatment. States that he will need to leave early for appointment at 3:00pm    Pt accompanied by: self  PERTINENT HISTORY: Pt with hx of HTN, Hyperlipidemia, Prediabetes and recent diagnosis of Ataxia per MD note on 10/3. Also noted hx of OA and possible gout. Pt reports to  PT with diagnosis of ataxia. States that he felling like his ankles are sore and week limiting long distance gait.   PAIN:  Are you having pain? Yes: NPRS scale: 6/10 Pain location: Bil ankles     Pain description: throbbing  Aggravating factors: walking  Relieving factors: rest. Hemp oil   PRECAUTIONS: None  RED FLAGS: None   WEIGHT BEARING RESTRICTIONS: No  FALLS:  Has patient fallen in last 6 months? No  LIVING ENVIRONMENT: Lives with: lives with their spouse Lives in: House/apartment Stairs: Yes: External:  5 steps; on left going up Has following equipment at home: walking stick   OCCUPATION: retired. Work in Nurse, adult. Enjoys working in the yard.   PLOF: Independent and Independent with basic ADLs  PATIENT GOALS: improve BLE strength, L>R   NEXT MD VISIT: roughly 6 months with PCP and neurology   OBJECTIVE:  Note: Objective measures were completed at Evaluation unless otherwise noted.   DIAGNOSTIC FINDINGS:  No relevant imaging.   COGNITION: Overall cognitive status: Within functional limits for tasks assessed   SENSATION: WFL  COORDINATION: Mild tremor in Bil hands at end range with finger to nose Decreased speed LLE   EDEMA:  None   MUSCLE TONE: WFL    POSTURE: rounded shoulders and forward head  LOWER EXTREMITY ROM:     WFL BLE   LOWER EXTREMITY MMT:    MMT Right Eval Left Eval R 12/9 L 12/9  Hip flexion 4 4+ 5 4+  Hip extension      Hip abduction 4+ 4+ 5 5  Hip adduction 5 5 5 5   Hip internal rotation      Hip external rotation      Knee flexion 4+ 4+ 5 4+  Knee extension 4 4+ 5 5  Ankle dorsiflexion 4+ 5 5 5   Ankle plantarflexion      Ankle inversion 4+ 4 5 4+  Ankle eversion 4+ 4+ 5 4+  (Blank rows = not tested)  BED MOBILITY:  Sit to supine Complete Independence Supine to sit Complete Independence  TRANSFERS: Assistive device utilized: None  Sit to stand: Complete Independence Stand to sit: Complete  Independence Chair to chair: Complete Independence Floor: TBD  RAMP:  Level of Assistance: Complete Independence Assistive device utilized: None Ramp Comments:   CURB:  Level of Assistance: Modified independence Assistive device utilized:  rail on descent  Curb Comments: mild lob with descent   STAIRS: Level of Assistance: Modified independence Stair Negotiation Technique: Alternating Pattern  with Single Rail on Right Number of Stairs: 4  Height of Stairs: 6  Comments: mild LOB on initial descent step.   GAIT: Gait pattern:  mild foot slap on the LLE>  and wide BOS Distance walked: 60 Assistive device utilized: None Level of assistance: Complete Independence Comments:   FUNCTIONAL TESTS:  See below   PATIENT SURVEYS:  FOTO 60 (goal 64)  TODAY'S TREATMENT:                                                                                                                              DATE:    Goal assessment for Discharge Summary. See below for details.  PT performed STM for Bil Feet, ankles, and lower leg. Cross frictional massage in plantar surface of the foot  and TPR performed to tibialis anterior and soleus/gastroc complex. X 10 min total.      PATIENT EDUCATION: Education details: POC. Goals. Pt educated throughout session about proper posture and technique with  exercises. Improved exercise technique, movement at target joints, use of target muscles after min to mod verbal, visual, tactile cues.   Person educated: Patient Education method: Explanation Education comprehension: verbalized understanding  HOME EXERCISE PROGRAM: Access Code: [email or web address] URL: [email or web address] Date: 06/24/2023 Prepared by: Grier Rocher  Exercises - Seated Ankle Plantarflexion with Resistance  - 1 x daily - 7 x weekly - 3 sets - 15 reps - 2 hold - Seated Ankle Dorsiflexion with Anchored Resistance  - 1 x daily - 7 x weekly - 3 sets - 15 reps - 2 hold - Standing  Single Leg Stance with Unilateral Counter Support  - 1 x daily - 7 x weekly - 3 sets - 4 reps - 10 hold - Standing Tandem Balance with Counter Support  - 1 x daily - 7 x weekly - 3 sets - 10 reps - 15 hold - Feet Together Balance at [employer] Eyes Closed  - 1 x daily - 7 x weekly - 3 sets - 10 reps - 10 hold - Squat with Counter Support  - 1 x daily - 7 x weekly - 3 sets - 10 reps - Heel Toe Raises with Counter Support  - 1 x daily - 7 x weekly - 3 sets - 10 reps - Side Lunge with Counter Support  - 1 x daily - 7 x weekly - 3 sets - 10 reps  GOALS: Goals reviewed with patient? Yes   SHORT TERM GOALS: Target date: 06/10/2023    Patient will be independent in home exercise program to improve strength/mobility for better functional independence with ADLs. Baseline: given on 10/16 Goal status: MET   LONG TERM GOALS: Target date: 06/10/2023    Patient will increase FOTO score to equal to or greater than  64   to demonstrate statistically significant improvement in mobility and quality of life.  Baseline: 60 11/13: 62 12/9: 66 Goal status: MET  2.  Patient (> 44 years old) will increase 6 min walk test by >119ft indicating an increased LE strength and improved balance. Baseline:1049 with mild SOB  11/13: 1256 with no SOB, mild soreness in the L ankle, no pain in the R ankle  12/9: 1239ft with no report of foot or ankle      Goal status: MET  3.  Patient will increase FGA score by > 4 points to demonstrate decreased fall risk during functional activities Baseline: 20 11/13: 26 12/9: 28 Goal status: MET  4.  Patient will increase reported time in standing without pain to > 30 minutes to improve safety and function with ADLs.  Baseline: 10-15 minutes  11/13: 15-20 minutes  12/9: at least 30 minutes   Goal status: MET    ASSESSMENT:  CLINICAL IMPRESSION: Patient is a 82 y.o.  male who was seen today for physical therapy  treatment for balance issues. PT focused on goal  assessment for discharge. Pt demonstrates improved balance, strength, endurance and reduced pain allowing improved function and access to community. Pt tolerated treatment well with STM for pain management in Bil feet and Bil LE.  Due to reduced pain, improved function and fall risk, pt will no longer need skilled PT treatment at this time.   OBJECTIVE IMPAIRMENTS: decreased coordination, decreased mobility, difficulty walking, and decreased strength.   ACTIVITY LIMITATIONS: squatting, stairs, and locomotion level  PARTICIPATION LIMITATIONS: shopping, community activity, and yard work  PERSONAL FACTORS: Age and 1-2 comorbidities: HTN, OA prediabetes.   are also affecting patient's functional outcome.  REHAB POTENTIAL: Good  CLINICAL DECISION MAKING: Stable/uncomplicated  EVALUATION COMPLEXITY: Low  PLAN:  PT FREQUENCY: 1x/week  PT DURATION: 6 weeks  PLANNED INTERVENTIONS: 97110-Therapeutic exercises, 97530- Therapeutic activity, [id number]- Neuromuscular re-education, 97535- Self Care, 32440- Manual therapy, [id number]- Gait training, [id number]- Aquatic Therapy, 97014- Electrical stimulation (unattended), [id number]- Electrical stimulation (manual), [id number]- Ultrasound, 10272- Ionotophoresis 4mg /ml Dexamethasone, Balance training, Stair training, Dry Needling, Joint mobilization, Joint manipulation, DME instructions, Cryotherapy, and Moist heat  PLAN FOR NEXT SESSION:  D/C.   Golden Pop, PT 06/28/2023, 2:00 PM

## 2023-06-30 ENCOUNTER — Ambulatory Visit: Payer: PPO | Admitting: Physical Therapy

## 2023-07-07 ENCOUNTER — Telehealth: Payer: Self-pay

## 2023-07-07 ENCOUNTER — Encounter: Payer: Self-pay | Admitting: Podiatry

## 2023-07-07 ENCOUNTER — Ambulatory Visit: Payer: PPO | Admitting: Podiatry

## 2023-07-07 DIAGNOSIS — Z79899 Other long term (current) drug therapy: Secondary | ICD-10-CM | POA: Diagnosis not present

## 2023-07-07 DIAGNOSIS — L603 Nail dystrophy: Secondary | ICD-10-CM | POA: Diagnosis not present

## 2023-07-07 MED ORDER — TERBINAFINE HCL 250 MG PO TABS: 250.0000 mg | ORAL_TABLET | Freq: Every day | ORAL | 0 refills | Status: DC

## 2023-07-07 NOTE — Progress Notes (Signed)
Jon Fischer presents today for follow-up of his nail fungus.  He has completed his first 30 days of Lamisil.  He denies fever chills nausea vomiting muscle aches pains calf pain back pain chest pain shortness of breath.  Objective: Vital signs are stable alert oriented x 3 no change in physical exam.  Assessment: Long-term therapy with Lamisil.  Plan: Dispensed a prescription for a comprehensive metabolic panel.  Also dispensed prescription for 90 days of Lamisil.  Follow-up with him in 4 months should his blood work come back abnormal we will notify him.

## 2023-07-07 NOTE — Telephone Encounter (Signed)
PA request received from [employer] for Terbinafine 250mg . Tried to submit online at [employer],but could not submit electronically. I called [facility] pharmacy PA department. PA is not required for medication.

## 2023-07-08 LAB — COMPREHENSIVE METABOLIC PANEL
ALT: 9 [IU]/L (ref 0–44)
AST: 11 [IU]/L (ref 0–40)
Albumin: 4.2 g/dL (ref 3.7–4.7)
Alkaline Phosphatase: 85 [IU]/L (ref 44–121)
BUN/Creatinine Ratio: 16 (ref 10–24)
BUN: 19 mg/dL (ref 8–27)
Bilirubin Total: 0.5 mg/dL (ref 0.0–1.2)
CO2: 24 mmol/L (ref 20–29)
Calcium: 9.3 mg/dL (ref 8.6–10.2)
Chloride: 104 mmol/L (ref 96–106)
Creatinine, Ser: 1.2 mg/dL (ref 0.76–1.27)
Globulin, Total: 2.1 g/dL (ref 1.5–4.5)
Glucose: 89 mg/dL (ref 70–99)
Potassium: 4.8 mmol/L (ref 3.5–5.2)
Sodium: 140 mmol/L (ref 134–144)
Total Protein: 6.3 g/dL (ref 6.0–8.5)
eGFR: 61 mL/min/{1.73_m2} (ref >59)

## 2023-07-27 ENCOUNTER — Telehealth: Payer: Self-pay | Admitting: *Deleted

## 2023-07-27 NOTE — Telephone Encounter (Signed)
-----   Message from Elinor Parkinson sent at 07/27/2023  7:52 AM EST ----- Blood work is good.

## 2023-08-16 ENCOUNTER — Telehealth: Payer: Self-pay | Admitting: Podiatry

## 2023-08-16 ENCOUNTER — Other Ambulatory Visit: Payer: Self-pay | Admitting: *Deleted

## 2023-08-16 DIAGNOSIS — B079 Viral wart, unspecified: Secondary | ICD-10-CM | POA: Diagnosis not present

## 2023-08-16 MED ORDER — TERBINAFINE HCL 250 MG PO TABS: 250.0000 mg | ORAL_TABLET | Freq: Every day | ORAL | 0 refills | Status: AC

## 2023-08-16 NOTE — Telephone Encounter (Signed)
Patient came into the [facility] office stating [facility] wont cover his Terbinafine [facility]CI 250mg . Per the pt he said they sent a care plan over to Dr Al Corpus. I let the patient know we have received anything in [facility] for him.

## 2023-08-16 NOTE — Telephone Encounter (Signed)
Patient's insurance denied #90 day supply of lamisil-sent new 90 day Rx to [facility] and Good Rx coupon to the patient. No insurance will be filed - cost should be $28.92

## 2023-08-31 ENCOUNTER — Encounter: Payer: Self-pay | Admitting: Podiatry

## 2023-08-31 ENCOUNTER — Other Ambulatory Visit: Payer: HMO

## 2023-08-31 ENCOUNTER — Ambulatory Visit: Payer: PPO | Admitting: Podiatry

## 2023-08-31 DIAGNOSIS — N401 Enlarged prostate with lower urinary tract symptoms: Secondary | ICD-10-CM

## 2023-08-31 DIAGNOSIS — M79676 Pain in unspecified toe(s): Secondary | ICD-10-CM | POA: Diagnosis not present

## 2023-08-31 DIAGNOSIS — R972 Elevated prostate specific antigen [PSA]: Secondary | ICD-10-CM | POA: Diagnosis not present

## 2023-08-31 DIAGNOSIS — B351 Tinea unguium: Secondary | ICD-10-CM

## 2023-08-31 NOTE — Progress Notes (Signed)
   Chief Complaint  Patient presents with   Nail Problem    "I have a toenail infection that I'm being treated for.  My doctor told me to keep my nails trimmed.  I have a hard time doing that."    SUBJECTIVE Patient presents to office today complaining of elongated, thickened nails that cause pain while ambulating in shoes.  Patient is unable to trim their own nails. Patient is here for further evaluation and treatment.  Past Medical History:  Diagnosis Date   Hypercholesteremia    Hypertension     No Known Allergies   OBJECTIVE General Patient is awake, alert, and oriented x 3 and in no acute distress. Derm Skin is dry and supple bilateral. Negative open lesions or macerations. Remaining integument unremarkable. Nails are tender, long, thickened and dystrophic with subungual debris, consistent with onychomycosis, 1-5 bilateral. No signs of infection noted. Vasc  DP and PT pedal pulses palpable bilaterally. Temperature gradient within normal limits.  Neuro Epicritic and protective threshold sensation grossly intact bilaterally.  Musculoskeletal Exam No symptomatic pedal deformities noted bilateral. Muscular strength within normal limits.  ASSESSMENT 1.  Pain due to onychomycosis of toenails both  PLAN OF CARE 1. Patient evaluated today.  2. Instructed to maintain good pedal hygiene and foot care.  3. Mechanical debridement of nails 1-5 bilaterally performed using a nail nipper. Filed with dremel without incident.  4.  Continue oral Lamisil as prescribed.  Patient tolerating it well 5.  Return to clinic in 3 mos.    Felecia Shelling, DPM [facility]  Dr. Felecia Shelling, DPM    [address] [place], [place] [address]                Office [phone]  Fax [phone]

## 2023-09-01 LAB — PSA: Prostate Specific Ag, Serum: 10.5 ng/mL — ABNORMAL HIGH (ref 0.0–4.0)

## 2023-09-04 ENCOUNTER — Encounter: Payer: Self-pay | Admitting: Urology

## 2023-09-13 ENCOUNTER — Ambulatory Visit: Payer: HMO | Admitting: Sleep Medicine

## 2023-09-13 ENCOUNTER — Encounter: Payer: Self-pay | Admitting: Sleep Medicine

## 2023-09-13 VITALS — BP 136/74 | HR 56 | Temp 97.8°F | Ht 70.0 in | Wt 250.0 lb

## 2023-09-13 DIAGNOSIS — G4733 Obstructive sleep apnea (adult) (pediatric): Secondary | ICD-10-CM | POA: Diagnosis not present

## 2023-09-13 DIAGNOSIS — I10 Essential (primary) hypertension: Secondary | ICD-10-CM | POA: Diagnosis not present

## 2023-09-13 DIAGNOSIS — E785 Hyperlipidemia, unspecified: Secondary | ICD-10-CM

## 2023-09-13 NOTE — Progress Notes (Addendum)
 Name:Jon Fischer MRN: [id number] DOB: 03/08/1941   CHIEF COMPLAINT:  ESTABLISH CARE FOR OSA   HISTORY OF PRESENT ILLNESS:  Jon Fischer is an 83 y.o. w/ a h/o OSA, HTN, hyperlipidemia and obesity who presents to establish care for OSA. Reports that he was initially diagnosed with OSA in 2009 and subsequently started on PAP therapy. Reports ASV PAP therapy every night, which is confirmed by compliance data. He is currently using the Vitera FFM, which is comfortable. Reports feeling more refreshed upon awakening with CPAP therapy. Reports occasional air leaks. Denies dry mouth, morning headaches or night sweats. Reports nocturnal awakenings due to unclear reasons, however does not have difficulty falling back to sleep. Denies any significant weight changes. Denies a family history of sleep apnea. Denies drowsy driving. Drinks 2-3 cups of coffee daily, occasional alcohol use, smokes cigars occasionally, denies illicit drug use.   Bedtime 11 pm Sleep onset 5 mins Rise time 7:30 am   EPWORTH SLEEP SCORE 7     No data to display          PAST MEDICAL HISTORY :   has a past medical history of Hypercholesteremia and Hypertension.  has no past surgical history on file. Prior to Admission medications   Medication Sig Start Date End Date Taking? Authorizing Provider  allopurinol (ZYLOPRIM) 100 MG tablet Take by mouth.   Yes [provider]  Ascorbic Acid (VITAMIN C) 1000 MG tablet Take 1,000 mg by mouth.   Yes [provider]  aspirin EC 81 MG tablet Take by mouth.   Yes [provider]  docusate sodium (STOOL SOFTENER) 100 MG capsule Take 100 mg by mouth.   Yes [provider]  finasteride (PROSCAR) 5 MG tablet Take 1 tablet (5 mg total) by mouth daily. 06/21/23  Yes Fischer, Jon Czech, MD  lisinopril (PRINIVIL,ZESTRIL) 20 MG tablet  08/09/17  Yes [provider]  Menthol, Topical Analgesic, (MINERAL ICE) 2 % GEL  07/09/14  Yes [provider]  sildenafil (VIAGRA) 100 MG tablet Take 1 tablet (100 mg total) by mouth as needed for erectile dysfunction. 10/06/21  Yes Fischer, Jon Czech, MD  simvastatin (ZOCOR) 10 MG tablet Take by mouth. 06/19/14  Yes [provider]  tamsulosin (FLOMAX) 0.4 MG CAPS capsule Take 1 capsule (0.4 mg total) by mouth 2 (two) times daily. 06/21/23  Yes Fischer, Jon Czech, MD  terbinafine (LAMISIL) 250 MG tablet Take 1 tablet (250 mg total) by mouth daily. 08/16/23  Yes Fischer, Jon T, DPM  verapamil (VERELAN PM) 120 MG 24 hr capsule Take by mouth. 06/20/14  Yes [provider]  vitamin E 400 UNIT capsule Take by mouth.   Yes [provider]   No Known Allergies  FAMILY HISTORY:  family history is not on file. SOCIAL HISTORY:  reports that he has been smoking cigars. He has never used smokeless tobacco. He reports current alcohol use.   Review of Systems:  Gen:  Denies  fever, sweats, chills weight loss  HEENT: Denies blurred vision, double vision, ear pain, eye pain, hearing loss, nose bleeds, sore throat Cardiac:  No dizziness, chest pain or heaviness, chest tightness,edema, No JVD Resp:   No cough, -sputum production, -shortness of breath,-wheezing, -hemoptysis,  Gi: Denies swallowing difficulty, stomach pain, nausea or vomiting, diarrhea, constipation, bowel incontinence Gu:  Denies bladder incontinence, burning urine Ext:   Denies Joint pain, stiffness or swelling Skin: Denies  skin rash, easy bruising or  bleeding or hives Endoc:  Denies polyuria, polydipsia , polyphagia or weight change Psych:   Denies depression, insomnia or hallucinations  Other:  All other systems negative  VITAL SIGNS: BP 136/74 (BP Location: Left Arm, Patient Position: Sitting, Cuff Size: Normal)   Pulse (!) 56   Temp 97.8 F (36.6 C) (Temporal)   Ht 5\' 10"  (1.778 m)   Wt 250 lb (113.4 kg)   SpO2 97%   BMI 35.87 kg/m     Physical Examination:   General Appearance: No distress   EYES PERRLA, EOM intact.   NECK Supple, No JVD Pulmonary: normal breath sounds, No wheezing.  CardiovascularNormal S1,S2.  No m/r/g.   Abdomen: Benign, Soft, non-tender. Skin:   warm, no rashes, no ecchymosis  Extremities: normal, no cyanosis, clubbing. Neuro:without focal findings,  speech normal  PSYCHIATRIC: Mood, affect within normal limits.   ASSESSMENT AND PLAN  OSA Patient is using and benefiting from ASV PAP therapy. Discussed the consequences of untreated sleep apnea. Advised not to drive drowsy for safety of patient and others. Will follow up in 1 year to review PAP efficacy and compliance data.   HTN Stable, on current management. Following with PCP.   Hyperlipidemia Stable, on current management. Following with PCP.    Patient satisfied with Plan of action and management. All questions answered  I spent a total of 30 minutes reviewing chart data, face-to-face evaluation with the patient, counseling and coordination of care as detailed above.    Tempie Hoist, M.D.  Sleep [facility]

## 2023-09-13 NOTE — Patient Instructions (Signed)
 Patient Instructions   Will follow up in 1 year.    Continue to use CPAP every night, minimum of 7-8 hours a night.  Change equipment every 30 days or as directed by DME.  Wash your tubing with warm soap and water weekly, hang to dry. Wash humidifier portion weekly. Use distilled water and change daily   Be aware of reduced alertness and do not drive or operate heavy machinery if experiencing this or drowsiness.  Exercise encouraged, as tolerated. Encouraged proper weight management.  Important to get eight or more hours of sleep  Limiting the use of the computer and television before bedtime.  Decrease naps during the day, so night time sleep will become enhanced.  Limit caffeine, and sleep deprivation.  HTN, stroke, uncontrolled diabetes and heart failure are potential risk factors.  Risk of untreated sleep apnea including cardiac arrhthymias, stroke, DM, pulm HTN.

## 2023-10-25 DIAGNOSIS — E782 Mixed hyperlipidemia: Secondary | ICD-10-CM | POA: Diagnosis not present

## 2023-10-25 DIAGNOSIS — R7303 Prediabetes: Secondary | ICD-10-CM | POA: Diagnosis not present

## 2023-10-25 DIAGNOSIS — Z0000 Encounter for general adult medical examination without abnormal findings: Secondary | ICD-10-CM | POA: Diagnosis not present

## 2023-10-25 DIAGNOSIS — G4733 Obstructive sleep apnea (adult) (pediatric): Secondary | ICD-10-CM | POA: Diagnosis not present

## 2023-10-25 DIAGNOSIS — E669 Obesity, unspecified: Secondary | ICD-10-CM | POA: Diagnosis not present

## 2023-10-25 DIAGNOSIS — M109 Gout, unspecified: Secondary | ICD-10-CM | POA: Diagnosis not present

## 2023-10-25 DIAGNOSIS — I10 Essential (primary) hypertension: Secondary | ICD-10-CM | POA: Diagnosis not present

## 2023-10-25 DIAGNOSIS — M1990 Unspecified osteoarthritis, unspecified site: Secondary | ICD-10-CM | POA: Diagnosis not present

## 2023-10-25 DIAGNOSIS — R972 Elevated prostate specific antigen [PSA]: Secondary | ICD-10-CM | POA: Diagnosis not present

## 2023-10-25 LAB — LAB REPORT - SCANNED
A1c: 5.8
Albumin, Urine POC: 0.7
Creatinine, POC: 145 mg/dL
EGFR: 58
Microalb Creat Ratio: 4.8

## 2023-11-03 ENCOUNTER — Ambulatory Visit: Payer: PPO | Admitting: Podiatry

## 2023-11-08 ENCOUNTER — Ambulatory Visit: Admitting: Podiatry

## 2023-11-08 ENCOUNTER — Encounter: Payer: Self-pay | Admitting: Podiatry

## 2023-11-08 DIAGNOSIS — M79676 Pain in unspecified toe(s): Secondary | ICD-10-CM

## 2023-11-08 DIAGNOSIS — Z79899 Other long term (current) drug therapy: Secondary | ICD-10-CM | POA: Diagnosis not present

## 2023-11-08 DIAGNOSIS — B351 Tinea unguium: Secondary | ICD-10-CM

## 2023-11-08 NOTE — Progress Notes (Signed)
 He presents today chief complaint of painful elongated toenails and for follow-up of his onychomycosis particularly the hallux right.  He states that he still has about 15 or 20 pills left as he demonstrates them to me.  Objective: Vital signs are stable he is alert oriented x 3 toenails are long thick yellow dystrophic onychomycotic particularly the hallux right though does appear to be clearing from proximal distal.  Assessment: Long-term therapy with Lamisil  for onychomycosis.  Plan: Debrided mycotic nails 1 through 5 bilaterally he will continue the current medication he has and will follow-up with me in 3 months no additional Lamisil  was provided.

## 2023-11-10 NOTE — Addendum Note (Signed)
 Addended by: Pheobe Brass E on: 11/10/2023 11:22 AM   Modules accepted: Level of Service

## 2023-11-18 DIAGNOSIS — G4733 Obstructive sleep apnea (adult) (pediatric): Secondary | ICD-10-CM | POA: Diagnosis not present

## 2023-11-18 DIAGNOSIS — G4731 Primary central sleep apnea: Secondary | ICD-10-CM | POA: Diagnosis not present

## 2023-12-18 DIAGNOSIS — E669 Obesity, unspecified: Secondary | ICD-10-CM | POA: Diagnosis not present

## 2023-12-18 DIAGNOSIS — I10 Essential (primary) hypertension: Secondary | ICD-10-CM | POA: Diagnosis not present

## 2023-12-18 DIAGNOSIS — E782 Mixed hyperlipidemia: Secondary | ICD-10-CM | POA: Diagnosis not present

## 2023-12-18 DIAGNOSIS — M1990 Unspecified osteoarthritis, unspecified site: Secondary | ICD-10-CM | POA: Diagnosis not present

## 2024-01-17 DIAGNOSIS — E782 Mixed hyperlipidemia: Secondary | ICD-10-CM | POA: Diagnosis not present

## 2024-01-17 DIAGNOSIS — M1990 Unspecified osteoarthritis, unspecified site: Secondary | ICD-10-CM | POA: Diagnosis not present

## 2024-01-17 DIAGNOSIS — I10 Essential (primary) hypertension: Secondary | ICD-10-CM | POA: Diagnosis not present

## 2024-01-17 DIAGNOSIS — E669 Obesity, unspecified: Secondary | ICD-10-CM | POA: Diagnosis not present

## 2024-01-19 DIAGNOSIS — D2272 Melanocytic nevi of left lower limb, including hip: Secondary | ICD-10-CM | POA: Diagnosis not present

## 2024-01-19 DIAGNOSIS — L821 Other seborrheic keratosis: Secondary | ICD-10-CM | POA: Diagnosis not present

## 2024-01-19 DIAGNOSIS — D2261 Melanocytic nevi of right upper limb, including shoulder: Secondary | ICD-10-CM | POA: Diagnosis not present

## 2024-01-19 DIAGNOSIS — D2271 Melanocytic nevi of right lower limb, including hip: Secondary | ICD-10-CM | POA: Diagnosis not present

## 2024-01-19 DIAGNOSIS — D225 Melanocytic nevi of trunk: Secondary | ICD-10-CM | POA: Diagnosis not present

## 2024-01-19 DIAGNOSIS — L570 Actinic keratosis: Secondary | ICD-10-CM | POA: Diagnosis not present

## 2024-01-19 DIAGNOSIS — D2262 Melanocytic nevi of left upper limb, including shoulder: Secondary | ICD-10-CM | POA: Diagnosis not present

## 2024-02-09 ENCOUNTER — Ambulatory Visit: Admitting: Podiatry

## 2024-02-09 ENCOUNTER — Encounter: Payer: Self-pay | Admitting: Podiatry

## 2024-02-09 DIAGNOSIS — M79676 Pain in unspecified toe(s): Secondary | ICD-10-CM | POA: Diagnosis not present

## 2024-02-09 DIAGNOSIS — B351 Tinea unguium: Secondary | ICD-10-CM | POA: Diagnosis not present

## 2024-02-09 NOTE — Progress Notes (Signed)
 Presents today for follow-up of his painful elongated toenails and onychomycosis hallux right.  States that the toenail is starting to look better.  Objective: Vital signs are stable oriented x 3 toenails are long thick yellow dystrophic likely mycotic his hallux nail right does demonstrate some residual onychomycosis but appears to be clearing.  Assessment: Well-healing onychomycosis and pain limb secondary to onychomycosis.  Plan: Debridement of toenails 1 through 5 bilateral.

## 2024-02-18 ENCOUNTER — Other Ambulatory Visit: Payer: Self-pay

## 2024-02-18 DIAGNOSIS — R972 Elevated prostate specific antigen [PSA]: Secondary | ICD-10-CM

## 2024-02-21 ENCOUNTER — Other Ambulatory Visit: Payer: Self-pay

## 2024-02-21 DIAGNOSIS — R972 Elevated prostate specific antigen [PSA]: Secondary | ICD-10-CM

## 2024-02-22 LAB — PSA: Prostate Specific Ag, Serum: 9.1 ng/mL — ABNORMAL HIGH (ref 0.0–4.0)

## 2024-02-24 ENCOUNTER — Ambulatory Visit: Payer: PPO | Admitting: Urology

## 2024-02-25 ENCOUNTER — Ambulatory Visit: Payer: Self-pay | Admitting: Urology

## 2024-02-25 ENCOUNTER — Encounter: Payer: Self-pay | Admitting: Urology

## 2024-02-25 VITALS — BP 151/77 | HR 54

## 2024-02-25 DIAGNOSIS — N401 Enlarged prostate with lower urinary tract symptoms: Secondary | ICD-10-CM

## 2024-02-25 DIAGNOSIS — R972 Elevated prostate specific antigen [PSA]: Secondary | ICD-10-CM | POA: Diagnosis not present

## 2024-02-25 NOTE — Progress Notes (Signed)
   02/25/2024 10:50 AM   Jon Fischer Feb 19, 1941 [id number]  Referring provider: Loreli Kins, MD [address] [place],  [place] [address]  Chief Complaint  Patient presents with   Elevated PSA   Urologic history:   1.  BPH with LUTS -On combination therapy tamsulosin  0.8 mg/finasteride  -Episode acute urinary retention October 2020 with ~2 L of urine obtained -Started tamsulosin  and post catheter removal residuals 170-190 mL w/ subsequent bladder scan residuals January 2021 and April 2021 0 mL and 38 mL respectively -Finasteride  added November 2020   2.  Elevated PSA -Long history increased PSA with negative prostate biopsies x3 -PSA as high as 12.48   3.  Erectile dysfunction -On sildenafil  100 mg   HPI: Jon Fischer is a 83 y.o. male who presents for annual follow-up  Doing well since last visit No bothersome LUTS Remains on tamsulosin /finasteride  Denies dysuria, gross hematuria Denies flank, abdominal or pelvic pain PSA 02/21/2024 stable 9.1 (uncorrected)   PMH: Past Medical History:  Diagnosis Date   Hypercholesteremia    Hypertension     Surgical History: History reviewed. No pertinent surgical history.  Home Medications:  Allergies as of 02/25/2024   No Known Allergies      Medication List        Accurate as of February 25, 2024 10:50 AM. If you have any questions, ask your nurse or doctor.          allopurinol 100 MG tablet Commonly known as: ZYLOPRIM Take by mouth.   aspirin EC 81 MG tablet Take by mouth.   finasteride  5 MG tablet Commonly known as: PROSCAR  Take 1 tablet (5 mg total) by mouth daily.   lisinopril 20 MG tablet Commonly known as: ZESTRIL   Mineral Ice 2 % Gel Generic drug: Menthol (Topical Analgesic)   sildenafil  100 MG tablet Commonly known as: VIAGRA  Take 1 tablet (100 mg total) by mouth as needed for erectile dysfunction.   simvastatin 10 MG tablet Commonly known as: ZOCOR Take by  mouth.   Stool Softener 100 MG capsule Generic drug: docusate sodium Take 100 mg by mouth.   tamsulosin  0.4 MG Caps capsule Commonly known as: FLOMAX  Take 1 capsule (0.4 mg total) by mouth 2 (two) times daily.   terbinafine  250 MG tablet Commonly known as: LAMISIL  Take 1 tablet (250 mg total) by mouth daily.   verapamil 120 MG 24 hr capsule Commonly known as: VERELAN Take by mouth.   vitamin C 1000 MG tablet Take 1,000 mg by mouth.   vitamin E 180 MG (400 UNITS) capsule Take by mouth.        Allergies: No Known Allergies  Family History: History reviewed. No pertinent family history.  Social History:  reports that he has been smoking cigars. He has never used smokeless tobacco. He reports current alcohol use. He reports that he does not use drugs.   Physical Exam: BP (!) 151/77   Pulse (!) 54   SpO2 98%   Constitutional:  Alert and oriented, No acute distress. HEENT: [place] AT Respiratory: Normal respiratory effort, no increased work of breathing. GU: Prostate 60 g, smooth without nodules   Assessment & Plan:    1.  Elevated PSA Stable Benign DRE Follow-up 1 year with PSA/DRE  2.  BPH with LUTS Stable on finasteride  and tamsulosin    Glendia JAYSON Barba, MD  [facility] [address] [place], [place] [address] [phone]

## 2024-02-28 ENCOUNTER — Encounter: Payer: Self-pay | Admitting: Urology

## 2024-04-06 DIAGNOSIS — R7303 Prediabetes: Secondary | ICD-10-CM | POA: Diagnosis not present

## 2024-04-06 DIAGNOSIS — I10 Essential (primary) hypertension: Secondary | ICD-10-CM | POA: Diagnosis not present

## 2024-04-06 DIAGNOSIS — Z23 Encounter for immunization: Secondary | ICD-10-CM | POA: Diagnosis not present

## 2024-04-06 DIAGNOSIS — E782 Mixed hyperlipidemia: Secondary | ICD-10-CM | POA: Diagnosis not present

## 2024-04-24 DIAGNOSIS — H35371 Puckering of macula, right eye: Secondary | ICD-10-CM | POA: Diagnosis not present

## 2024-04-24 DIAGNOSIS — Z961 Presence of intraocular lens: Secondary | ICD-10-CM | POA: Diagnosis not present

## 2024-04-24 DIAGNOSIS — H43813 Vitreous degeneration, bilateral: Secondary | ICD-10-CM | POA: Diagnosis not present

## 2024-04-25 ENCOUNTER — Telehealth: Payer: Self-pay | Admitting: Sleep Medicine

## 2024-04-25 NOTE — Telephone Encounter (Signed)
 Copied from CRM #[id number]. Topic: General - Other >> Apr 24, 2024 10:03 AM Corean SAUNDERS wrote: Reason for CRM: Patient has an upcoming appointment on 10/8 at 11 am and is requesting to know if he will need to bring in his Bipap machine and if [facility] will send the clinic his report?   Please call patient back and advise.

## 2024-04-26 ENCOUNTER — Ambulatory Visit: Admitting: Sleep Medicine

## 2024-04-26 ENCOUNTER — Encounter: Payer: Self-pay | Admitting: Sleep Medicine

## 2024-04-26 VITALS — BP 120/80 | HR 54 | Temp 98.1°F | Ht 70.0 in | Wt 242.4 lb

## 2024-04-26 DIAGNOSIS — I10 Essential (primary) hypertension: Secondary | ICD-10-CM

## 2024-04-26 DIAGNOSIS — G4733 Obstructive sleep apnea (adult) (pediatric): Secondary | ICD-10-CM

## 2024-04-26 DIAGNOSIS — J309 Allergic rhinitis, unspecified: Secondary | ICD-10-CM

## 2024-04-26 MED ORDER — FLUTICASONE PROPIONATE 50 MCG/ACT NA SUSP: 2.0000 | Freq: Every day | NASAL | 3 refills | Status: AC

## 2024-04-26 NOTE — Patient Instructions (Addendum)

## 2024-04-26 NOTE — Progress Notes (Signed)
 Name:Jon Fischer MRN: [id number] DOB: 15-Apr-1941   CHIEF COMPLAINT:  PAP F/U   HISTORY OF PRESENT ILLNESS:  Jon Fischer is an 83 y.o. w/ a h/o OSA, HTN, hyperlipidemia and obesity who presents for ASV follow up visit. Reports ASV PAP therapy every night, which is confirmed by compliance data. He is currently using the Vitera FFM, which is comfortable. Reports feeling more refreshed upon awakening with PAP therapy.    EPWORTH SLEEP SCORE 7     No data to display          PAST MEDICAL HISTORY :   has a past medical history of Hypercholesteremia and Hypertension.  has no past surgical history on file. Prior to Admission medications   Medication Sig Start Date End Date Taking? Authorizing Provider  allopurinol (ZYLOPRIM) 100 MG tablet Take by mouth.   Yes [provider]  Ascorbic Acid (VITAMIN C) 1000 MG tablet Take 1,000 mg by mouth.   Yes [provider]  aspirin EC 81 MG tablet Take by mouth.   Yes [provider]  docusate sodium (STOOL SOFTENER) 100 MG capsule Take 100 mg by mouth.   Yes [provider]  finasteride  (PROSCAR ) 5 MG tablet Take 1 tablet (5 mg total) by mouth daily. 06/21/23  Yes [name], Glendia BROCKS, MD  lisinopril (PRINIVIL,ZESTRIL) 20 MG tablet  08/09/17  Yes [provider]  Menthol, Topical Analgesic, (MINERAL ICE) 2 % GEL  07/09/14  Yes [provider]  sildenafil  (VIAGRA ) 100 MG tablet Take 1 tablet (100 mg total) by mouth as needed for erectile dysfunction. 10/06/21  Yes [name], Glendia BROCKS, MD  simvastatin (ZOCOR) 10 MG tablet Take by mouth. 06/19/14  Yes [provider]  tamsulosin  (FLOMAX ) 0.4 MG CAPS capsule Take 1 capsule (0.4 mg total) by mouth 2 (two) times daily. 06/21/23  Yes [name], Glendia BROCKS, MD  terbinafine  (LAMISIL ) 250 MG tablet Take 1 tablet (250 mg total) by mouth daily. 08/16/23  Yes [name], [name], DPM  verapamil (VERELAN PM) 120 MG 24 hr capsule Take by mouth. 06/20/14  Yes  [provider]  vitamin E 400 UNIT capsule Take by mouth.   Yes [provider]   No Known Allergies  FAMILY HISTORY:  family history is not on file. SOCIAL HISTORY:  reports that he has been smoking cigars. He has never used smokeless tobacco. He reports current alcohol use. He reports that he does not use drugs.   Review of Systems:  Gen:  Denies  fever, sweats, chills weight loss  HEENT: Denies blurred vision, double vision, ear pain, eye pain, hearing loss, nose bleeds, sore throat Cardiac:  No dizziness, chest pain or heaviness, chest tightness,edema, No JVD Resp:   No cough, -sputum production, -shortness of breath,-wheezing, -hemoptysis,  Gi: Denies swallowing difficulty, stomach pain, nausea or vomiting, diarrhea, constipation, bowel incontinence Gu:  Denies bladder incontinence, burning urine Ext:   Denies Joint pain, stiffness or swelling Skin: Denies  skin rash, easy bruising or bleeding or hives Endoc:  Denies polyuria, polydipsia , polyphagia or weight change Psych:   Denies depression, insomnia or hallucinations  Other:  All other systems negative  VITAL SIGNS: BP 120/80   Pulse (!) 54   Temp 98.1 F (36.7 C)   Ht 5' 10 (1.778 m)   Wt 242 lb 6.4 oz (110 kg)   SpO2 97%   BMI 34.78 kg/m     Physical Examination:   General Appearance: No  distress  EYES PERRLA, EOM intact.   NECK Supple, No JVD Pulmonary: normal breath sounds, No wheezing.  CardiovascularNormal S1,S2.  No m/r/g.   Abdomen: Benign, Soft, non-tender. Skin:   warm, no rashes, no ecchymosis  Extremities: normal, no cyanosis, clubbing. Neuro:without focal findings,  speech normal  PSYCHIATRIC: Mood, affect within normal limits.   ASSESSMENT AND PLAN  OSA Patient is using and benefiting from ASV PAP therapy. Discussed the consequences of untreated sleep apnea. Advised not to drive drowsy for safety of patient and others. Will follow up in 1 year to review PAP efficacy and  compliance data.   HTN Stable, on current management. Following with PCP.   Allergic rhinitis Will try patient on Flonase nasal spray.    Patient satisfied with Plan of action and management. All questions answered  I spent a total of 34 minutes reviewing chart data, face-to-face evaluation with the patient, counseling and coordination of care as detailed above.    Aunica Dauphinee, M.D.  Sleep Medicine [facility]

## 2024-05-10 ENCOUNTER — Ambulatory Visit: Admitting: Podiatry

## 2024-05-10 DIAGNOSIS — M79674 Pain in right toe(s): Secondary | ICD-10-CM

## 2024-05-10 DIAGNOSIS — M79675 Pain in left toe(s): Secondary | ICD-10-CM | POA: Diagnosis not present

## 2024-05-10 DIAGNOSIS — B351 Tinea unguium: Secondary | ICD-10-CM

## 2024-05-10 NOTE — Progress Notes (Signed)
 Presents today chief complaint of painful elongated toenails.  Objective: Toenails are long thick yellow dystrophic clinically mycotic hallux right appears to be growing out from treatment with Lamisil .  Assessment: Well-healing onychomycosis hallux right.  Pain in limb secondary to nail dystrophy bilateral.  Plan: Debridement of toenails 1 through 5 bilateral.

## 2024-05-19 ENCOUNTER — Other Ambulatory Visit: Payer: Self-pay | Admitting: Urology

## 2024-08-08 ENCOUNTER — Other Ambulatory Visit (HOSPITAL_COMMUNITY): Payer: Self-pay

## 2024-08-10 ENCOUNTER — Other Ambulatory Visit: Payer: Self-pay

## 2024-08-10 ENCOUNTER — Other Ambulatory Visit (HOSPITAL_COMMUNITY): Payer: Self-pay

## 2024-08-11 ENCOUNTER — Other Ambulatory Visit (HOSPITAL_BASED_OUTPATIENT_CLINIC_OR_DEPARTMENT_OTHER): Payer: Self-pay

## 2024-08-11 ENCOUNTER — Other Ambulatory Visit (HOSPITAL_COMMUNITY): Payer: Self-pay

## 2024-08-11 MED ORDER — LISINOPRIL 20 MG PO TABS
20.0000 mg | ORAL_TABLET | Freq: Every day | ORAL | 1 refills | Status: AC
  Filled 2024-08-11 – 2024-08-18 (×4): qty 90, 90d supply, fill #0

## 2024-08-11 MED ORDER — VERAPAMIL HCL ER 120 MG PO TBCR
120.0000 mg | EXTENDED_RELEASE_TABLET | Freq: Every day | ORAL | 1 refills | Status: AC
  Filled 2024-08-11 – 2024-08-18 (×4): qty 90, 90d supply, fill #0

## 2024-08-11 MED ORDER — ALLOPURINOL 100 MG PO TABS
100.0000 mg | ORAL_TABLET | Freq: Every day | ORAL | 1 refills | Status: AC
  Filled 2024-08-11 – 2024-08-18 (×4): qty 90, 90d supply, fill #0

## 2024-08-14 ENCOUNTER — Other Ambulatory Visit: Payer: Self-pay

## 2024-08-14 ENCOUNTER — Other Ambulatory Visit (HOSPITAL_COMMUNITY): Payer: Self-pay

## 2024-08-15 ENCOUNTER — Other Ambulatory Visit (HOSPITAL_COMMUNITY): Payer: Self-pay

## 2024-08-15 ENCOUNTER — Encounter (HOSPITAL_COMMUNITY): Payer: Self-pay

## 2024-08-15 MED ORDER — SIMVASTATIN 10 MG PO TABS
10.0000 mg | ORAL_TABLET | Freq: Every day | ORAL | 1 refills | Status: AC
  Filled 2024-08-15 – 2024-08-18 (×4): qty 100, 100d supply, fill #0

## 2024-08-17 ENCOUNTER — Other Ambulatory Visit: Payer: Self-pay

## 2024-08-17 ENCOUNTER — Other Ambulatory Visit (HOSPITAL_COMMUNITY): Payer: Self-pay

## 2024-08-18 ENCOUNTER — Other Ambulatory Visit (HOSPITAL_COMMUNITY): Payer: Self-pay

## 2024-08-18 ENCOUNTER — Other Ambulatory Visit: Payer: Self-pay

## 2024-08-21 ENCOUNTER — Other Ambulatory Visit (HOSPITAL_COMMUNITY): Payer: Self-pay

## 2024-08-28 ENCOUNTER — Other Ambulatory Visit

## 2025-02-23 ENCOUNTER — Other Ambulatory Visit

## 2025-03-02 ENCOUNTER — Ambulatory Visit: Admitting: Urology
